# Patient Record
Sex: Male | Born: 1955
Health system: Southern US, Community
[De-identification: ages and names within clinical notes are randomized; demographics above are authoritative.]

## PROBLEM LIST (undated history)

## (undated) DIAGNOSIS — I7100 Dissection of unspecified site of aorta: Secondary | ICD-10-CM

## (undated) DIAGNOSIS — I10 Essential (primary) hypertension: Secondary | ICD-10-CM

## (undated) DIAGNOSIS — I2510 Atherosclerotic heart disease of native coronary artery without angina pectoris: Secondary | ICD-10-CM

## (undated) DIAGNOSIS — J3800 Paralysis of vocal cords and larynx, unspecified: Secondary | ICD-10-CM

## (undated) DIAGNOSIS — K219 Gastro-esophageal reflux disease without esophagitis: Secondary | ICD-10-CM

## (undated) HISTORY — DX: Essential (primary) hypertension: I10

## (undated) HISTORY — DX: Atherosclerotic heart disease of native coronary artery without angina pectoris: I25.10

## (undated) HISTORY — DX: Paralysis of vocal cords and larynx, unspecified: J38.00

## (undated) HISTORY — DX: Dissection of unspecified site of aorta: I71.00

## (undated) HISTORY — DX: Gastro-esophageal reflux disease without esophagitis: K21.9

---

## 1990-05-14 HISTORY — PX: LAPAROTOMY: SHX154

## 1994-05-14 HISTORY — PX: SEPTOPLASTY: SUR1290

## 2003-02-08 ENCOUNTER — Encounter: Admission: RE | Admit: 2003-02-08 | Discharge: 2003-05-09 | Payer: Self-pay | Admitting: Internal Medicine

## 2003-05-15 HISTORY — PX: LARYNGOPLASTY: SHX282

## 2003-10-28 ENCOUNTER — Encounter: Payer: Self-pay | Admitting: Internal Medicine

## 2003-12-17 ENCOUNTER — Ambulatory Visit (HOSPITAL_COMMUNITY): Admission: RE | Admit: 2003-12-17 | Discharge: 2003-12-17 | Payer: Self-pay | Admitting: Cardiology

## 2004-10-03 ENCOUNTER — Ambulatory Visit: Payer: Self-pay | Admitting: Internal Medicine

## 2004-10-10 ENCOUNTER — Ambulatory Visit: Payer: Self-pay | Admitting: Internal Medicine

## 2004-10-18 ENCOUNTER — Ambulatory Visit: Payer: Self-pay | Admitting: Internal Medicine

## 2005-01-26 ENCOUNTER — Ambulatory Visit: Payer: Self-pay | Admitting: Cardiovascular Disease

## 2005-02-05 ENCOUNTER — Ambulatory Visit: Payer: Self-pay

## 2005-05-14 HISTORY — PX: COLONOSCOPY: SHX174

## 2005-05-15 ENCOUNTER — Ambulatory Visit: Payer: Self-pay | Admitting: Internal Medicine

## 2005-09-17 ENCOUNTER — Ambulatory Visit: Payer: Self-pay | Admitting: Internal Medicine

## 2005-10-01 ENCOUNTER — Ambulatory Visit: Payer: Self-pay | Admitting: Internal Medicine

## 2005-10-29 ENCOUNTER — Ambulatory Visit: Payer: Self-pay | Admitting: Gastroenterology

## 2005-11-30 ENCOUNTER — Ambulatory Visit: Payer: Self-pay | Admitting: Gastroenterology

## 2006-08-15 ENCOUNTER — Ambulatory Visit: Payer: Self-pay | Admitting: Internal Medicine

## 2006-11-21 ENCOUNTER — Ambulatory Visit: Payer: Self-pay | Admitting: Internal Medicine

## 2006-11-26 ENCOUNTER — Encounter (INDEPENDENT_AMBULATORY_CARE_PROVIDER_SITE_OTHER): Payer: Self-pay | Admitting: *Deleted

## 2006-11-26 ENCOUNTER — Encounter: Payer: Self-pay | Admitting: Internal Medicine

## 2007-07-28 ENCOUNTER — Ambulatory Visit: Payer: Self-pay | Admitting: Internal Medicine

## 2008-02-11 ENCOUNTER — Telehealth (INDEPENDENT_AMBULATORY_CARE_PROVIDER_SITE_OTHER): Payer: Self-pay | Admitting: *Deleted

## 2008-04-29 ENCOUNTER — Ambulatory Visit: Payer: Self-pay | Admitting: Internal Medicine

## 2008-04-29 DIAGNOSIS — K219 Gastro-esophageal reflux disease without esophagitis: Secondary | ICD-10-CM | POA: Insufficient documentation

## 2008-04-29 DIAGNOSIS — I10 Essential (primary) hypertension: Secondary | ICD-10-CM | POA: Insufficient documentation

## 2008-05-31 ENCOUNTER — Telehealth (INDEPENDENT_AMBULATORY_CARE_PROVIDER_SITE_OTHER): Payer: Self-pay | Admitting: *Deleted

## 2009-01-13 ENCOUNTER — Encounter: Payer: Self-pay | Admitting: Internal Medicine

## 2009-07-08 ENCOUNTER — Telehealth (INDEPENDENT_AMBULATORY_CARE_PROVIDER_SITE_OTHER): Payer: Self-pay | Admitting: *Deleted

## 2009-08-05 ENCOUNTER — Ambulatory Visit: Payer: Self-pay | Admitting: Internal Medicine

## 2009-08-05 DIAGNOSIS — I2510 Atherosclerotic heart disease of native coronary artery without angina pectoris: Secondary | ICD-10-CM | POA: Insufficient documentation

## 2009-08-19 ENCOUNTER — Ambulatory Visit: Payer: Self-pay | Admitting: Family Medicine

## 2009-08-22 LAB — CONVERTED CEMR LAB
ALT: 19 units/L (ref 0–53)
AST: 19 U/L (ref 0–37)
Albumin: 4 g/dL (ref 3.5–5.2)
Alkaline Phosphatase: 39 units/L (ref 39–117)
BUN: 13 mg/dL (ref 6–23)
Basophils Absolute: 0 10*3/uL (ref 0.0–0.1)
Basophils Relative: 0.8 % (ref 0.0–3.0)
Bilirubin, Direct: 0.1 mg/dL (ref 0.0–0.3)
CO2: 30 meq/L (ref 19–32)
Calcium: 9.3 mg/dL (ref 8.4–10.5)
Chloride: 103 meq/L (ref 96–112)
Cholesterol: 206 mg/dL — ABNORMAL HIGH (ref 0–200)
Creatinine, Ser: 1.1 mg/dL (ref 0.4–1.5)
Direct LDL: 154.8 mg/dL
Eosinophils Absolute: 0.3 10*3/uL (ref 0.0–0.7)
Eosinophils Relative: 5.5 % — ABNORMAL HIGH (ref 0.0–5.0)
GFR calc non Af Amer: 73.65 mL/min (ref >60)
Glucose, Bld: 91 mg/dL (ref 70–99)
HCT: 44.1 % (ref 39.0–52.0)
HDL: 39.3 mg/dL (ref >39.00)
Hemoglobin: 15.2 g/dL (ref 13.0–17.0)
Lymphocytes Relative: 27.9 % (ref 12.0–46.0)
Lymphs Abs: 1.5 10*3/uL (ref 0.7–4.0)
MCHC: 34.4 g/dL (ref 30.0–36.0)
MCV: 90.9 fL (ref 78.0–100.0)
Monocytes Absolute: 0.4 10*3/uL (ref 0.1–1.0)
Monocytes Relative: 8.1 % (ref 3.0–12.0)
Neutro Abs: 3.2 10*3/uL (ref 1.4–7.7)
Neutrophils Relative %: 57.7 % (ref 43.0–77.0)
PSA: 0.77 ng/mL (ref 0.10–4.00)
Platelets: 165 10*3/uL (ref 150.0–400.0)
Potassium: 4.4 meq/L (ref 3.5–5.1)
RBC: 4.85 M/uL (ref 4.22–5.81)
RDW: 14 % (ref 11.5–14.6)
Sodium: 138 meq/L (ref 135–145)
TSH: 0.78 u[IU]/mL (ref 0.35–5.50)
Total Bilirubin: 0.9 mg/dL (ref 0.3–1.2)
Total CHOL/HDL Ratio: 5
Total Protein: 6.9 g/dL (ref 6.0–8.3)
Triglycerides: 125 mg/dL (ref 0.0–149.0)
VLDL: 25 mg/dL (ref 0.0–40.0)
WBC: 5.5 10*3/uL (ref 4.5–10.5)

## 2009-08-29 ENCOUNTER — Telehealth: Payer: Self-pay | Admitting: Internal Medicine

## 2009-09-19 ENCOUNTER — Telehealth: Payer: Self-pay | Admitting: Internal Medicine

## 2009-09-21 ENCOUNTER — Ambulatory Visit: Payer: Self-pay | Admitting: Internal Medicine

## 2009-09-21 DIAGNOSIS — M7660 Achilles tendinitis, unspecified leg: Secondary | ICD-10-CM | POA: Insufficient documentation

## 2009-10-13 ENCOUNTER — Ambulatory Visit: Payer: Self-pay | Admitting: Sports Medicine

## 2009-12-02 ENCOUNTER — Emergency Department (HOSPITAL_COMMUNITY): Admission: EM | Admit: 2009-12-02 | Discharge: 2009-12-02 | Payer: Self-pay | Admitting: Emergency Medicine

## 2009-12-06 ENCOUNTER — Telehealth (INDEPENDENT_AMBULATORY_CARE_PROVIDER_SITE_OTHER): Payer: Self-pay | Admitting: *Deleted

## 2009-12-07 ENCOUNTER — Encounter: Payer: Self-pay | Admitting: Internal Medicine

## 2009-12-07 ENCOUNTER — Emergency Department (HOSPITAL_COMMUNITY): Admission: EM | Admit: 2009-12-07 | Discharge: 2009-12-07 | Payer: Self-pay | Admitting: Emergency Medicine

## 2009-12-07 ENCOUNTER — Ambulatory Visit: Payer: Self-pay | Admitting: Vascular Surgery

## 2009-12-07 ENCOUNTER — Ambulatory Visit (HOSPITAL_COMMUNITY): Admission: RE | Admit: 2009-12-07 | Discharge: 2009-12-07 | Payer: Self-pay | Admitting: Internal Medicine

## 2009-12-07 ENCOUNTER — Ambulatory Visit: Payer: Self-pay | Admitting: Internal Medicine

## 2009-12-07 DIAGNOSIS — R609 Edema, unspecified: Secondary | ICD-10-CM | POA: Insufficient documentation

## 2009-12-08 ENCOUNTER — Ambulatory Visit: Payer: Self-pay | Admitting: Internal Medicine

## 2009-12-08 DIAGNOSIS — I82409 Acute embolism and thrombosis of unspecified deep veins of unspecified lower extremity: Secondary | ICD-10-CM | POA: Insufficient documentation

## 2009-12-09 ENCOUNTER — Ambulatory Visit: Payer: Self-pay | Admitting: Internal Medicine

## 2009-12-19 ENCOUNTER — Ambulatory Visit: Payer: Self-pay | Admitting: Internal Medicine

## 2009-12-19 LAB — CONVERTED CEMR LAB: POC INR: 1.3

## 2009-12-21 ENCOUNTER — Ambulatory Visit: Payer: Self-pay | Admitting: Internal Medicine

## 2009-12-21 LAB — CONVERTED CEMR LAB: INR: 1.5

## 2009-12-26 ENCOUNTER — Ambulatory Visit: Payer: Self-pay | Admitting: Internal Medicine

## 2009-12-26 LAB — CONVERTED CEMR LAB: POC INR: 1.9

## 2009-12-27 ENCOUNTER — Telehealth (INDEPENDENT_AMBULATORY_CARE_PROVIDER_SITE_OTHER): Payer: Self-pay | Admitting: *Deleted

## 2010-01-20 ENCOUNTER — Ambulatory Visit: Payer: Self-pay | Admitting: Internal Medicine

## 2010-01-20 LAB — CONVERTED CEMR LAB: POC INR: 1.8

## 2010-02-10 ENCOUNTER — Ambulatory Visit: Payer: Self-pay | Admitting: Internal Medicine

## 2010-02-10 LAB — CONVERTED CEMR LAB: INR: 1.7

## 2010-02-20 ENCOUNTER — Ambulatory Visit: Payer: Self-pay | Admitting: Internal Medicine

## 2010-02-20 LAB — CONVERTED CEMR LAB: INR: 2.4

## 2010-03-24 ENCOUNTER — Ambulatory Visit: Payer: Self-pay | Admitting: Internal Medicine

## 2010-03-24 LAB — CONVERTED CEMR LAB: INR: 2.1

## 2010-03-27 ENCOUNTER — Telehealth: Payer: Self-pay | Admitting: Internal Medicine

## 2010-03-30 ENCOUNTER — Encounter: Payer: Self-pay | Admitting: Internal Medicine

## 2010-05-29 ENCOUNTER — Telehealth (INDEPENDENT_AMBULATORY_CARE_PROVIDER_SITE_OTHER): Payer: Self-pay | Admitting: *Deleted

## 2010-06-02 ENCOUNTER — Ambulatory Visit
Admission: RE | Admit: 2010-06-02 | Discharge: 2010-06-02 | Payer: Self-pay | Source: Home / Self Care | Attending: Internal Medicine | Admitting: Internal Medicine

## 2010-06-02 LAB — CONVERTED CEMR LAB: INR: 2.5

## 2010-06-11 LAB — CONVERTED CEMR LAB
ALT: 33 U/L (ref 0–53)
AST: 30 units/L (ref 0–37)
Albumin: 3.9 g/dL (ref 3.5–5.2)
Alkaline Phosphatase: 44 U/L (ref 39–117)
BUN: 14 mg/dL (ref 6–23)
Basophils Absolute: 0 10*3/uL (ref 0.0–0.1)
Basophils Relative: 0.2 % (ref 0.0–1.0)
Bilirubin, Direct: 0.1 mg/dL (ref 0.0–0.3)
CO2: 30 meq/L (ref 19–32)
Calcium: 9.3 mg/dL (ref 8.4–10.5)
Chloride: 99 meq/L (ref 96–112)
Cholesterol: 238 mg/dL (ref 0–200)
Creatinine, Ser: 1.2 mg/dL (ref 0.4–1.5)
Direct LDL: 171.9 mg/dL
Eosinophils Absolute: 0.2 10*3/uL (ref 0.0–0.6)
Eosinophils Relative: 5.4 % — ABNORMAL HIGH (ref 0.0–5.0)
GFR calc Af Amer: 81 mL/min
GFR calc non Af Amer: 67 mL/min
Glucose, Bld: 95 mg/dL (ref 70–99)
HCT: 44.6 % (ref 39.0–52.0)
HDL: 47.3 mg/dL (ref >39.0)
Hemoglobin: 15.3 g/dL (ref 13.0–17.0)
Hgb A1c MFr Bld: 5.7 % (ref 4.6–6.0)
Lymphocytes Relative: 31.9 % (ref 12.0–46.0)
MCHC: 34.2 g/dL (ref 30.0–36.0)
MCV: 91.4 fL (ref 78.0–100.0)
Monocytes Absolute: 0.3 10*3/uL (ref 0.2–0.7)
Monocytes Relative: 7.6 % (ref 3.0–11.0)
Neutro Abs: 2.2 10*3/uL (ref 1.4–7.7)
Neutrophils Relative %: 54.9 % (ref 43.0–77.0)
PSA: 0.53 ng/mL (ref 0.10–4.00)
Platelets: 177 10*3/uL (ref 150–400)
Potassium: 4.6 meq/L (ref 3.5–5.1)
RBC: 4.88 M/uL (ref 4.22–5.81)
RDW: 12.8 % (ref 11.5–14.6)
Sodium: 136 meq/L (ref 135–145)
TSH: 0.58 u[IU]/mL (ref 0.35–5.50)
Testosterone: 543.03 ng/dL (ref 350.00–890)
Total Bilirubin: 1.2 mg/dL (ref 0.3–1.2)
Total CHOL/HDL Ratio: 5
Total Protein: 6.6 g/dL (ref 6.0–8.3)
Triglycerides: 64 mg/dL (ref 0–149)
VLDL: 13 mg/dL (ref 0–40)
WBC: 3.9 10*3/uL — ABNORMAL LOW (ref 4.5–10.5)

## 2010-06-13 NOTE — Assessment & Plan Note (Signed)
Summary: RETURN VISIT FROM YESTERDAY///SPH   Vital Signs:  Patient profile:   55 year old male Weight:      198 pounds Pulse rate:   72 / minute Resp:     14 per minute BP standing:   110 / 70  (left arm)  Vitals Entered By: Shonna Chock CMA (December 08, 2009 12:08 PM) CC: Follow-up visit: copy of report given   CC:  Follow-up visit: copy of report given.  History of Present Illness: Mr. Holdren was started on Lovenox & Coumadin in ER 12/07/2009 for L popliteal DVT. It is still unclear why this occurred. He had been using a desk chair which appeared to compress that area. The LLE edema  & pain have  decreased since 07/27. No cardiopulmonary symptoms.  Current Medications (verified): 1)  Adult Aspirin Ec Low Strength 81 Mg  Tbec (Aspirin) .Marland Kitchen.. 1 By Mouth Qd 2)  Multivitamin 3)  Fish Oil 4)  Dhea 5)  Tribilus 6)  Ramipril 2.5 Mg  Caps (Ramipril) .... Take One Capsule Daily 7)  Zantac 150 Maximum Strength 150 Mg  Tabs (Ranitidine Hcl) .Marland Kitchen.. 1 Bid 8)  Amlodipine Besylate 2.5  Mg Tabs (Amlodipine Besylate) .Marland Kitchen.. 1 Once Daily 9)  Nitroglycerin 0.2 Mg/hr Pt24 (Nitroglycerin) .... 1/4 Patch Applied To Right Heel Every 24 Hours 10)  Arthrotec 50-200 Mg-Mcg Tabs (Diclofenac-Misoprostol) .Marland Kitchen.. 1 Qid Until Knee Pain & Swelling Gone  Allergies: 1)  ! * Detrol La  Review of Systems CV:  Denies chest pain or discomfort. Resp:  Denies chest pain with inspiration, coughing up blood, pleuritic, and shortness of breath.  Physical Exam  General:  well-nourished,in no acute distress; alert,appropriate and cooperative throughout examination Lungs:  Normal respiratory effort, chest expands symmetrically. Lungs are clear to auscultation, no crackles or wheezes. Heart:  Normal rate and regular rhythm. S1 and S2 normal without gallop, murmur, click, rub or other extra sounds. Pulses:  R and L carotid,radial,dorsalis pedis and posterior tibial pulses are full and equal bilaterally Extremities:  1/2+ left  pedal edema.     Impression & Recommendations:  Problem # 1:  DEEP VENOUS THROMBOPHLEBITIS, LEG, LEFT (ICD-453.40)  Acute L Popliteal Vein  Orders: Venipuncture (19147) TLB-PT (Protime) (85610-PTP)  Problem # 2:  EDEMA- LOCALIZED (ICD-782.3) Improved  Complete Medication List: 1)  Adult Aspirin Ec Low Strength 81 Mg Tbec (Aspirin) .Marland Kitchen.. 1 by mouth qd 2)  Multivitamin  3)  Fish Oil  4)  Dhea  5)  Tribilus  6)  Ramipril 2.5 Mg Caps (Ramipril) .... Take one capsule daily 7)  Zantac 150 Maximum Strength 150 Mg Tabs (Ranitidine hcl) .Marland Kitchen.. 1 bid 8)  Amlodipine Besylate 2.5 Mg Tabs (amlodipine Besylate)  .Marland Kitchen.. 1 once daily 9)  Nitroglycerin 0.2 Mg/hr Pt24 (Nitroglycerin) .... 1/4 patch applied to right heel every 24 hours 10)  Arthrotec 50-200 Mg-mcg Tabs (Diclofenac-misoprostol) .Marland Kitchen.. 1 qid until knee pain & swelling gone 11)  Warfarin Sodium 5 Mg Tabs (Warfarin sodium) .... Once daily after 5 pm as per instructions  Patient Instructions: 1)  Stop aspirin while on warfarin. Have a PT/INR in 5-7 days. PT/INR goal = 2.0-3.5. Elevate leg as much as possible. Support hose when up on L leg. Prescriptions: WARFARIN SODIUM 5 MG TABS (WARFARIN SODIUM) once daily after 5 pm as per instructions  #30 x 0   Entered and Authorized by:   Marga Melnick MD   Signed by:   Marga Melnick MD on 12/08/2009   Method used:  Faxed to ...       [facility]  [phone]* (retail)       [address] [place], [place]  [address]       Ph: [phone] or [phone]       Fax: [phone]   RxID:   [id number]   Appended Document: RETURN VISIT FROM YESTERDAY///SPH   INR POC 1.1  Vital Signs: Weight: 198 lbs.  Pulse Rate: 68  Blood Pressure:  110 / 70   Dietary changes: no    Health status changes: no    Bleeding/hemorrhagic complications: no    Recent/future hospitalizations: no    Any changes in medication regimen? no    Recent/future dental: no  Any missed doses?: no         Is patient compliant with meds? yes      Comments: INR   1.1 given to Dr. Alwyn Ren waiting further instructions.: warfarin 5 mg 1& 1/2 today ,then 1 once daily . Check PT/INR in 5-7 days in [place] while on vacation [place]  December 08, 2009 12:48 PM

## 2010-06-13 NOTE — Assessment & Plan Note (Signed)
 Summary: skin rash--acute only--tl   Vital Signs:  Patient Profile:   55 Years Old Male Weight:      185.38 pounds Pulse rate:   60 / minute Pulse rhythm:   regular Resp:     15 per minute BP sitting:   110 / 80  (left arm) Cuff size:   large  Pt. in pain?   no  Vitals Entered By: Scarlette Currier (July 28, 2007 5:00 PM)                  Chief Complaint:  rash all over body.  History of Present Illness:  Rash started on neck 1 week ago  but spread to involve  hands, face, legs and feet,worse last night and this am with itching. Rx: Benadryl, topical cortsone. No significant travel exposures, new meds, new clothing.    Current Allergies (reviewed today): ! * DETROL LA  Past Medical History:    Reviewed history and no changes required:       Current Problems:        DECREASED LIBIDO (ICD-799.81)       WEAKNESS, MUSCLE (ICD-728.87)       SYMPTOM, FREQUENCY, URINARY (ICD-788.41)       EXAMINATION, ROUTINE MEDICAL (ICD-V70.0)  Past Surgical History:    Reviewed history from 11/21/2006 and no changes required:       extensive cardiac hx ; MI/ stent 2005;1999 SBO S/P resection scar tissue; 1992 exploratory lap;laryngoplasty 2005; colonoscopy neg 2007; septoplasty   Family History:    Reviewed history from 11/21/2006 and no changes required:       Father: Parkinson's       Mother: breast CA       Siblings: neg  Social History:    Reviewed history and no changes required:       Never Smoked       Alcohol use-no   Risk Factors:  Tobacco use:  never Alcohol use:  no   Review of Systems  General      Denies chills, fever, sweats, and weight loss.  Eyes      Denies discharge, eye pain, and red eye.  ENT      Denies nasal congestion and sinus pressure.      No facial pain, frontal ha, purulence. No angioedema.  Resp      Denies cough, shortness of breath, sputum productive, and wheezing.   Physical Exam  General:     Well-developed,well-nourished,in  no acute distress; alert,appropriate and cooperative throughout examination Eyes:     No corneal or conjunctival inflammation noted. EOMI. Perrla.Vision grossly normal. Mouth:     Oral mucosa and oropharynx without lesions or exudates.  Teeth in good repair. Lungs:     Normal respiratory effort, chest expands symmetrically. Lungs are clear to auscultation, no crackles or wheezes. Abdomen:     Bowel sounds positive,abdomen soft and non-tender without masses, organomegaly or hernias noted. Midline op scar Skin:     Diffuse  urticaria. dermatographia. Cervical Nodes:     No lymphadenopathy noted Axillary Nodes:     No palpable lymphadenopathy    Impression & Recommendations:  Problem # 1:  URTICARIA (ICD-708.9)  Problem # 2:  HYPERTENSION, ESSENTIAL NOS (ICD-401.9)  The following medications were removed from the medication list:    Altace  2.5 Mg Caps (Ramipril ) .Aaron Aas... 1 by mouth qd  His updated medication list for this problem includes:    Ramipril  2.5 Mg Caps (Ramipril ) .Aaron Aas... Take one capsule daily  Complete Medication List: 1)  Adult Aspirin Ec Low Strength 81 Mg Tbec (Aspirin) .Aaron Aas.. 1 by mouth qd 2)  Multivitamin  3)  Fish Oil  4)  Dhea  5)  Tribilus  6)  Ramipril  2.5 Mg Caps (Ramipril ) .... Take one capsule daily 7)  Hydroxyzine Pamoate 25 Mg Caps (Hydroxyzine pamoate) .Aaron Aas.. 1-2 q 6 hrs as needed for hives 8)  Zantac  150 Maximum Strength 150 Mg Tabs (Ranitidine  hcl) .Aaron Aas.. 1 bid 9)  Prednisone 20 Mg Tabs (Prednisone) .Aaron Aas.. 1 two times a day with foodx 7 days then 1 qd   Patient Instructions: 1)  Bland diet; hypoallergic soap. Avoid specific foods as discussed. Hold ALL supplements  & meds.  2)  Check your Blood Pressure regularly. If it is above:130/85 on average you should make an appointment.    Prescriptions: PREDNISONE 20 MG  TABS (PREDNISONE) 1 two times a day with foodX 7 days then 1 qd  #21 x 0   Entered and Authorized by:   Loetta Ringer MD   Signed by:    Loetta Ringer MD on 07/28/2007   Method used:   Print then Give to Patient   RxID:   [id number] ZANTAC  150 MAXIMUM STRENGTH 150 MG  TABS (RANITIDINE  HCL) 1 bid  #60 x 0   Entered and Authorized by:   Loetta Ringer MD   Signed by:   Loetta Ringer MD on 07/28/2007   Method used:   Print then Give to Patient   RxID:   [id number] HYDROXYZINE PAMOATE 25 MG  CAPS (HYDROXYZINE PAMOATE) 1-2 q 6 hrs as needed for hives  #30 x 1   Entered and Authorized by:   Loetta Ringer MD   Signed by:   Loetta Ringer MD on 07/28/2007   Method used:   Print then Give to Patient   RxID:   [id number]  ]

## 2010-06-13 NOTE — Assessment & Plan Note (Signed)
Summary: remove stiches/PT/INR/cbs   Vital Signs:  Patient profile:   55 year old male Weight:      198.6 pounds Pulse rate:   72 / minute Resp:     15 per minute BP sitting:   104 / 70  (left arm) Cuff size:   large  Vitals Entered By: Shonna Chock CMA (December 21, 2009 3:03 PM) CC: 1.) Remove stitches- patient   2.) PT/INR Check   CC:  1.) Remove stitches- patient   2.) PT/INR Check.  History of Present Illness: These sutures were placed 12/11/2009 in ER in [place]. The wound had dehisced after he had been applying pressure in attempt to enhance healing.  A  PT/INR  was done 07/31 @ ER when laceration was restitched; he'll get the results. It was 1.3 on 08/08  here & 1.5 after 10 mg once daily X 2 days. Despite subtherapeutic reading he denies any C-P symptoms.He has been applying Neosporin to wound.  Current Medications (verified): 1)  Multivitamin 2)  Fish Oil 3)  Dhea 4)  Tribilus 5)  Ramipril 2.5 Mg  Caps (Ramipril) .... Take One Capsule Daily 6)  Zantac 150 Maximum Strength 150 Mg  Tabs (Ranitidine Hcl) .Marland Kitchen.. 1 Bid 7)  Amlodipine Besylate 2.5  Mg Tabs (Amlodipine Besylate) .Marland Kitchen.. 1 Once Daily 8)  Warfarin Sodium 5 Mg Tabs (Warfarin Sodium) .... Once Daily After 5 Pm As Per Instructions  Allergies: 1)  ! * Detrol La  Review of Systems General:  Denies chills, fever, and sweats. CV:  Denies chest pain or discomfort. Resp:  Denies chest pain with inspiration, coughing up blood, and pleuritic. Heme:  Denies abnormal bruising and bleeding.  Physical Exam  General:  well-nourished,in no acute distress; alert,appropriate and cooperative throughout examination Lungs:  Normal respiratory effort, chest expands symmetrically. Lungs are clear to auscultation, no crackles or wheezes. Heart:  regular rhythm, no murmur, no gallop, no rub, no JVD, and bradycardia.   Extremities:  No edema of LLE; neg Homan's Skin:  wound healing well w/o purulence or cellulitis. 3 sutures removed  w/o complication Cervical Nodes:  No lymphadenopathy noted Axillary Nodes:  No palpable lymphadenopathy   Impression & Recommendations:  Problem # 1:  ENCOUNTER FOR REMOVAL OF SUTURES (ICD-V58.32)  Problem # 2:  ENCOUNTER FOR LONG-TERM USE OF ANTICOAGULANTS (ICD-V58.61)  Problem # 3:  DEEP VENOUS THROMBOPHLEBITIS, LEG, LEFT (ICD-453.40)  Orders: Protime (53664QI)  Complete Medication List: 1)  Multivitamin  2)  Fish Oil  3)  Dhea  4)  Tribilus  5)  Ramipril 2.5 Mg Caps (Ramipril) .... Take one capsule daily 6)  Zantac 150 Maximum Strength 150 Mg Tabs (Ranitidine hcl) .Marland Kitchen.. 1 bid 7)  Amlodipine Besylate 2.5 Mg Tabs (amlodipine Besylate)  .Marland Kitchen.. 1 once daily 8)  Warfarin Sodium 5 Mg Tabs (Warfarin sodium) .... Once daily after 5 pm as per instructions  Patient Instructions: 1)  Coumadin 10 mg 08/10 & 08/11 then 5 mg 08/12, 13 & 14. PT/INR on Mon 08/15. Stop Neosporin ointment.   ANTICOAGULATION RECORD       NEW REGIMEN & LAB RESULTS Current INR: 1.5 Current Coumadin Dose(mg): Patient took 10mg  x 2  Regimen:   (no change)  Provider: Marga Melnick Other Comments: Last PT/INR check was Monday  MEDICATIONS * MULTIVITAMIN  * FISH OIL  * DHEA  * TRIBILUS  RAMIPRIL 2.5 MG  CAPS (RAMIPRIL) Take one capsule daily ZANTAC 150 MAXIMUM STRENGTH 150 MG  TABS (RANITIDINE HCL) 1 bid *  AMLODIPINE BESYLATE 2.5  MG TABS (AMLODIPINE BESYLATE) 1 once daily WARFARIN SODIUM 5 MG TABS (WARFARIN SODIUM) once daily after 5 pm as per instructions   Anticoagulation Visit Questionnaire      Coumadin dose missed/changed:  No      Abnormal Bleeding Symptoms:  No   Any diet changes including alcohol intake, vegetables or greens since the last visit:  No Any illnesses or hospitalizations since the last visit:  No Any signs of clotting since the last visit (including chest discomfort, dizziness, shortness of breath, arm tingling, slurred speech, swelling or redness in leg):  No

## 2010-06-13 NOTE — Assessment & Plan Note (Signed)
Summary: PT/CBS/Kafi Dotter  Nurse Visit   Vital Signs:  Patient profile:   55 year old male Height:      75.5 inches (191.77 cm) Weight:      206.50 pounds (93.86 kg) BMI:     25.56 Temp:     97.9 degrees F (36.61 degrees C) oral BP sitting:   116 / 70  (left arm) Cuff size:   regular  Vitals Entered By: Lucious Groves CMA (March 24, 2010 11:21 AM) CC: PT check/kb   Allergies: 1)  ! * Detrol La Laboratory Results   Blood Tests      INR: 2.1   (Normal Range: 0.88-1.12   Therap INR: 2.0-3.5)    Orders Added: 1)  Est. Patient Level I [16109] 2)  Protime [60454UJ]   ANTICOAGULATION RECORD PREVIOUS REGIMEN & LAB RESULTS   Previous INR:  2.4 on  02/20/2010 Previous Coumadin Dose(mg):  10mg  daily on  02/20/2010 Previous Regimen:  10mg  daily on  02/10/2010  NEW REGIMEN & LAB RESULTS Current INR: 2.1 Current Coumadin Dose(mg): 10 mg qd Regimen: 10mg  daily  (no change)  Provider: Alwyn Ren Repeat testing in: 3 weeks Other Comments: Patient notes that he has 10mg  tabs and takes one daily (no alternating). Per MD the patient will change to 10mg  once daily except for 15mg  on Wed. and re-check in 3 weeks. Lucious Groves CMA  March 24, 2010 11:18 AM   Dose has been reviewed with patient or caretaker during this visit. Reviewed by: Lucious Groves  Anticoagulation Visit Questionnaire Coumadin dose missed/changed:  No Abnormal Bleeding Symptoms:  No  Any diet changes including alcohol intake, vegetables or greens since the last visit:  No Any illnesses or hospitalizations since the last visit:  No Any signs of clotting since the last visit (including chest discomfort, dizziness, shortness of breath, arm tingling, slurred speech, swelling or redness in leg):  No  MEDICATIONS * MULTIVITAMIN  * FISH OIL  * DHEA  * TRIBILUS  RAMIPRIL 2.5 MG  CAPS (RAMIPRIL) Take one capsule daily * AMLODIPINE BESYLATE 2.5  MG TABS (AMLODIPINE BESYLATE) 1 once daily WARFARIN SODIUM 5 MG TABS  (WARFARIN SODIUM) as directed based on PT Check WARFARIN SODIUM 10 MG TABS (WARFARIN SODIUM) as directed based on PT check OMEPRAZOLE 20 MG CPDR (OMEPRAZOLE) 1 pill 30 min pre b'fast

## 2010-06-13 NOTE — Assessment & Plan Note (Signed)
Summary: pain in left leg w/ ankle swelling//kn   Vital Signs:  Patient profile:   55 year old male Weight:      198 pounds Temp:     98.3 degrees F oral Pulse rate:   72 / minute Resp:     15 per minute BP sitting:   118 / 70  (left arm) Cuff size:   large  Vitals Entered By: Shonna Chock CMA (December 07, 2009 12:06 PM) CC: Left leg pain and swelling since Friday, Lower Extremity Joint pain   CC:  Left leg pain and swelling since Friday and Lower Extremity Joint pain.  History of Present Illness:   Knee Pain      This is a 55 year old man who presents with L knee &  Lower Extremity Pain over past 4-5 days, ? since 07/23.  The patient reports swelling as of 07/26  and decreased ROM with knee flexion. He was doing leg presses 07/25. He  denies redness, giving away, locking, popping, stiffness for >1 hr, and weakness.  The pain is located in the L posterior  knee & calf.  The pain began with no injury.  The pain is described as aching, intermittent, and activity related .Walking "stiffens " it.. No evaluation to date.  The patient denies the following symptoms: fever, rash, photosensitivity, eye symptoms, diarrhea, and dysuria.   Rx: none ; elevation helps it. No  associated cardiopulmonary symptoms. No PMH  or FH of DVT.  Current Medications (verified): 1)  Adult Aspirin Ec Low Strength 81 Mg  Tbec (Aspirin) .Marland Kitchen.. 1 By Mouth Qd 2)  Multivitamin 3)  Fish Oil 4)  Dhea 5)  Tribilus 6)  Ramipril 2.5 Mg  Caps (Ramipril) .... Take One Capsule Daily 7)  Zantac 150 Maximum Strength 150 Mg  Tabs (Ranitidine Hcl) .Marland Kitchen.. 1 Bid 8)  Amlodipine Besylate 2.5  Mg Tabs (Amlodipine Besylate) .Marland Kitchen.. 1 Once Daily 9)  Nitroglycerin 0.2 Mg/hr Pt24 (Nitroglycerin) .... 1/4 Patch Applied To Right Heel Every 24 Hours  Allergies: 1)  ! * Detrol La  Review of Systems General:  Denies chills and sweats. Eyes:  Denies discharge, eye pain, and red eye. CV:  Complains of swelling of feet; denies chest pain or  discomfort, difficulty breathing at night, and difficulty breathing while lying down; Edema L calf & ankle. Resp:  Denies chest pain with inspiration, coughing up blood, shortness of breath, and wheezing. GU:  Denies discharge and hematuria.  Physical Exam  General:  well-nourished,in no acute distress; alert,appropriate and cooperative throughout examination Lungs:  Normal respiratory effort, chest expands symmetrically. Lungs are clear to auscultation, no crackles or wheezes. Heart:  Normal rate and regular rhythm. S1 and S2 normal without gallop, murmur, click, rub or other extra sounds. Abdomen:  Bowel sounds positive,abdomen soft and non-tender without masses, organomegaly or hernias noted.Op scar well healed Pulses:  R and L dorsalis pedis and posterior tibial pulses are full and equal bilaterally Extremities:  No clubbing, cyanosis. Trace - 1/2 + edema LLE to mid shin.Decreased ROM with flexion  L knee with slight crepitus .  tender L popliteal space . Neg Homan's  Neurologic:  alert & oriented X3.   Skin:  Intact without suspicious lesions or rashes Psych:  memory intact for recent and remote, normally interactive, and good eye contact.     Impression & Recommendations:  Problem # 1:  POPLITEAL CYST, LEFT (ICD-727.51)  acute  Orders: Radiology Referral (Radiology)  Problem # 2:  EDEMA- LOCALIZED (ICD-782.3)  due to #1  Orders: Radiology Referral (Radiology)  Problem # 3:  GERD (ICD-530.81) No PMH of ulcer His updated medication list for this problem includes:    Zantac 150 Maximum Strength 150 Mg Tabs (Ranitidine hcl) .Marland Kitchen... 1 bid  Complete Medication List: 1)  Adult Aspirin Ec Low Strength 81 Mg Tbec (Aspirin) .Marland Kitchen.. 1 by mouth qd 2)  Multivitamin  3)  Fish Oil  4)  Dhea  5)  Tribilus  6)  Ramipril 2.5 Mg Caps (Ramipril) .... Take one capsule daily 7)  Zantac 150 Maximum Strength 150 Mg Tabs (Ranitidine hcl) .Marland Kitchen.. 1 bid 8)  Amlodipine Besylate 2.5 Mg Tabs  (amlodipine Besylate)  .Marland Kitchen.. 1 once daily 9)  Nitroglycerin 0.2 Mg/hr Pt24 (Nitroglycerin) .... 1/4 patch applied to right heel every 24 hours 10)  Arthrotec 50-200 Mg-mcg Tabs (Diclofenac-misoprostol) .Marland Kitchen.. 1 qid until knee pain & swelling gone  Patient Instructions: 1)  Rest & , moist compresses & elevation as discussed. Go to Web MD for "POPLITEAL CYST" Prescriptions: ARTHROTEC 50-200 MG-MCG TABS (DICLOFENAC-MISOPROSTOL) 1 qid until knee pain & swelling gone  #30 x 0   Entered and Authorized by:   Marga Melnick MD   Signed by:   Marga Melnick MD on 12/07/2009   Method used:   Print then Give to Patient   RxID:   [id number]
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
## 2010-06-13 NOTE — Progress Notes (Signed)
 Summary: hop--refill  Phone Note Refill Request   Refills Requested: Medication #1:  RAMIPRIL  2.5 MG  CAPS Take one capsule daily [facility]--[phone]  Initial call taken by: Thompson Flight,  February 11, 2008 9:05 AM      Prescriptions: RAMIPRIL  2.5 MG  CAPS (RAMIPRIL ) Take one capsule daily  #90 x 0   Entered by:   Olla Bevels   Authorized by:   Loetta Ringer MD   Signed by:   Olla Bevels on 02/11/2008   Method used:   Printed then faxed to ...       [facility]* (mail-order)             ,          Ph: [phone]       Fax: [phone]   RxID:   [id number]

## 2010-06-13 NOTE — Progress Notes (Signed)
Summary: BP elevated  Phone Note Call from Patient Call back at [phone]   Caller: Patient Summary of Call: pt states that BP remains high at 130/90 on sunday and was taking by nurse.  Pt states that previous BP 140/90, and lower number never goes under 90. pt currently taking RAMIPRIL 2.5 MG one capsule daily.[facility]. pls advise  Follow-up for Phone Call        per dr Samreen Seltzer increase med to ramipril 5 mg once daily. pt aware,pt advise to call if BP still elevated...........Marland KitchenFelecia Deloach CMA  August 29, 2009 4:33 PM

## 2010-06-13 NOTE — Assessment & Plan Note (Signed)
Summary: talk to dr Kalyna Paolella & pt/cbs   Vital Signs:  Patient profile:   55 year old male Weight:      203 pounds BMI:     25.13 Temp:     97.7 degrees F oral Pulse rate:   76 / minute Resp:     15 per minute BP sitting:   114 / 72  (left arm) Cuff size:   large  Vitals Entered By: Shonna Chock CMA (February 20, 2010 1:23 PM) CC: 1.) Ask Dr.Gayle Martinez some questions  2.) PT Check   CC:  1.) Ask Dr.Kenedie Dirocco some questions  2.) PT Check.  History of Present Illness: He is still having edema intermittently ; compression garments help. He is concerned about clot "breaking off". PT/INR was 2.4 today, therapeutic.  Allergies: 1)  ! * Detrol La  Review of Systems CV:  Denies chest pain or discomfort, difficulty breathing at night, difficulty breathing while lying down, leg cramps with exertion, and shortness of breath with exertion. Resp:  Denies chest pain with inspiration, coughing up blood, shortness of breath, and wheezing. GI:  Complains of indigestion; denies abdominal pain, bloody stools, and dark tarry stools; Dyspepsia despite Ranitidine two times a day .  Physical Exam  General:  well-nourished,in no acute distress; alert,appropriate and cooperative throughout examination Lungs:  Normal respiratory effort, chest expands symmetrically. Lungs are clear to auscultation, no crackles or wheezes. Heart:  regular rhythm, no murmur, no gallop, no rub, and bradycardia.  S4 Pulses:  R and L  dorsalis pedis and posterior tibial pulses are full and equal bilaterally Extremities:  No clubbing, cyanosis, edema. Negative Homan's   Impression & Recommendations:  Problem # 1:  DEEP VENOUS THROMBOPHLEBITIS, LEG, LEFT (ICD-453.40)  Problem # 2:  ENCOUNTER FOR LONG-TERM USE OF ANTICOAGULANTS (ICD-V58.61)  Orders: Protime (16109UE)  Problem # 3:  GERD (ICD-530.81)  The following medications were removed from the medication list:    Zantac 150 Maximum Strength 150 Mg Tabs (Ranitidine hcl)  .Marland Kitchen... 1 bid His updated medication list for this problem includes:    Omeprazole 20 Mg Cpdr (Omeprazole) .Marland Kitchen... 1 pill 30 min pre b'fast  Complete Medication List: 1)  Multivitamin  2)  Fish Oil  3)  Dhea  4)  Tribilus  5)  Ramipril 2.5 Mg Caps (Ramipril) .... Take one capsule daily 6)  Amlodipine Besylate 2.5 Mg Tabs (amlodipine Besylate)  .Marland Kitchen.. 1 once daily 7)  Warfarin Sodium 5 Mg Tabs (Warfarin sodium) .... As directed based on pt check 8)  Warfarin Sodium 10 Mg Tabs (Warfarin sodium) .... As directed based on pt check 9)  Omeprazole 20 Mg Cpdr (Omeprazole) .Marland Kitchen.. 1 pill 30 min pre b'fast  Patient Instructions: 1)  No change in dose  ; recheck PT/INR in 4 weeks. Warfarin  X 6 months then repeat Venous Doppler. If negative ; then repeat Coagulation Panel off warfarin. 2)  Avoid foods high in acid (tomatoes, citrus juices, spicy foods). Avoid eating within two hours of lying down or before exercising. Do not over eat; try smaller more frequent meals. Elevate head of bed twelve inches when sleeping. Prescriptions: OMEPRAZOLE 20 MG CPDR (OMEPRAZOLE) 1 pill 30 min pre b'fast  #30 x 2   Entered and Authorized by:   Marga Melnick MD   Signed by:   Marga Melnick MD on 02/20/2010   Method used:   Print then Give to Patient   RxID:   [id number]    ANTICOAGULATION RECORD PREVIOUS REGIMEN & LAB  RESULTS   Previous INR:  1.7 on  02/10/2010 Previous Coumadin Dose(mg):  10mg  M/W/F/Sun, 7.5mg  all other days  on  02/10/2010 Previous Regimen:  10mg  daily on  02/10/2010  NEW REGIMEN & LAB RESULTS Current INR: 2.4 Current Coumadin Dose(mg): 10mg  daily Regimen: 10mg  daily  (no change)  Provider: Raye Slyter MEDICATIONS * MULTIVITAMIN  * FISH OIL  * DHEA  * TRIBILUS  RAMIPRIL 2.5 MG  CAPS (RAMIPRIL) Take one capsule daily * AMLODIPINE BESYLATE 2.5  MG TABS (AMLODIPINE BESYLATE) 1 once daily WARFARIN SODIUM 5 MG TABS (WARFARIN SODIUM) as directed based on PT Check WARFARIN SODIUM  10 MG TABS (WARFARIN SODIUM) as directed based on PT check OMEPRAZOLE 20 MG CPDR (OMEPRAZOLE) 1 pill 30 min pre b'fast   Anticoagulation Visit Questionnaire      Coumadin dose missed/changed:  No      Abnormal Bleeding Symptoms:  No   Any diet changes including alcohol intake, vegetables or greens since the last visit:  No Any illnesses or hospitalizations since the last visit:  No Any signs of clotting since the last visit (including chest discomfort, dizziness, shortness of breath, arm tingling, slurred speech, swelling or redness in leg):  No

## 2010-06-13 NOTE — Assessment & Plan Note (Signed)
Summary: PT CHECK//PH//ARRIVE AT 11AM  Nurse Visit   Vital Signs:  Patient profile:   55 year old male Weight:      200.4 pounds Pulse rate:   72 / minute BP sitting:   112 / 68  (left arm) Cuff size:   large  Vitals Entered By: Shonna Chock CMA (February 10, 2010 11:07 AM) CC: PT Check   Allergies: 1)  ! * Detrol La Laboratory Results   Blood Tests      INR: 1.7   (Normal Range: 0.88-1.12   Therap INR: 2.0-3.5)    Orders Added: 1)  Protime [85610QW] 2)  Est. Patient Level I [16109] Prescriptions: WARFARIN SODIUM 10 MG TABS (WARFARIN SODIUM) once daily on T. Th. Sat  #30 x 2   Entered by:   Shonna Chock CMA   Authorized by:   Marga Melnick MD   Signed by:   Shonna Chock CMA on 02/10/2010   Method used:   [facility] to        [phone][facility] (retail)       [address] [place], [place]  [address]       Ph: [phone] or [phone]       Fax: [phone]   RxID:   [id number]    ANTICOAGULATION RECORD PREVIOUS REGIMEN & LAB RESULTS   Previous INR:  1.5 on  12/21/2009 Previous Coumadin Dose(mg):  Patient took 10mg  x 2  on  12/21/2009   NEW REGIMEN & LAB RESULTS Current INR: 1.7 Current Coumadin Dose(mg): 10mg  M/W/F/Sun, 7.5mg  all other days  Regimen: 10mg  daily  Provider: Hopper,William      Repeat testing in: 1 week MEDICATIONS * MULTIVITAMIN  * FISH OIL  * DHEA  * TRIBILUS  RAMIPRIL 2.5 MG  CAPS (RAMIPRIL) Take one capsule daily ZANTAC 150 MAXIMUM STRENGTH 150 MG  TABS (RANITIDINE HCL) 1 bid * AMLODIPINE BESYLATE 2.5  MG TABS (AMLODIPINE BESYLATE) 1 once daily WARFARIN SODIUM 5 MG TABS (WARFARIN SODIUM) 1 1/2 M W F Sun once daily after 5 pm as per instructions WARFARIN SODIUM 10 MG TABS (WARFARIN SODIUM) once daily on T. Th. Sat   Anticoagulation Visit Questionnaire      Coumadin dose missed/changed:  No      Abnormal Bleeding Symptoms:  No   Any diet changes including alcohol intake, vegetables or greens  since the last visit:  No Any illnesses or hospitalizations since the last visit:  No Any signs of clotting since the last visit (including chest discomfort, dizziness, shortness of breath, arm tingling, slurred speech, swelling or redness in leg):  No

## 2010-06-13 NOTE — Progress Notes (Signed)
 Summary: refill - dr hopper  Phone Note Call from Patient Call back at Home Phone [phone]   Caller: Patient Summary of Call: patient needs 30 day supply of altace  2.5 mg once a day [facility] [address]  ----- his mail order wont arrive in time Initial call taken by: Sheryle [place],  May 31, 2008 8:44 AM  Follow-up for Phone Call        spoke withpt wofe to [facility] [address], per wifwe [facility] [place] Follow-up by: Olla Bevels,  May 31, 2008 9:05 AM      Prescriptions: RAMIPRIL  2.5 MG  CAPS (RAMIPRIL ) Take one capsule daily  #30 x 0   Entered by:   Olla Bevels   Authorized by:   Loetta Ringer MD   Signed by:   Olla Bevels on 05/31/2008   Method used:   Faxed to ...       [phone]* (retail)       [address] [place], [place]  [address]       Ph: [phone] or [phone]       Fax: [phone]   RxID:   [id number]

## 2010-06-13 NOTE — Assessment & Plan Note (Signed)
 Summary: CPX//TL   Vital Signs:  Patient Profile:   55 Years Old Male Weight:      168.50 pounds Pulse rate:   60 / minute BP sitting:   110 / 62  (left arm) Cuff size:   large  Pt. in pain?   no  Vitals Entered By: Scarlette Currier (November 21, 2006 8:57 AM)                 Chief Complaint:  cpx.  General Medical HPI:        Currently he is doing well without any significant new complaints.  Current Allergies (reviewed today): ! * DETROL LA Updated/Current Medications (including changes made in today's visit):  ALTACE  2.5 MG  CAPS (RAMIPRIL ) 1 by mouth qd ADULT ASPIRIN EC LOW STRENGTH 81 MG  TBEC (ASPIRIN) 1 by mouth qd * MULTIVITAMIN  * FISH OIL  * DHEA  * TRIBILUS    Past Surgical History:    extensive cardiac hx ; MI/ stent 2005;1999 SBO S/P resection scar tissue; 1992 exploratory lap;laryngoplasty 2005; colonoscopy neg 2007; septoplasty   Family History:    Father: Parkinson's    Mother: breast CA    Siblings: neg   Risk Factors:  Tobacco use:  never Alcohol use:  yes    Type:  rare Exercise:  yes    Type:  CVE 4-5 hrs/week w/o C-P sx   Review of Systems  General      Denies chills, fatigue, fever, loss of appetite, malaise, sleep disorder, sweats, weakness, and weight loss.  Eyes      Denies blurring, discharge, double vision, eye irritation, eye pain, halos, itching, light sensitivity, red eye, vision loss-1 eye, and vision loss-both eyes.  ENT      Complains of nasal congestion.      Denies decreased hearing, difficulty swallowing, ear discharge, earache, hoarseness, nosebleeds, postnasal drainage, ringing in ears, sinus pressure, and sore throat.  CV      Complains of bluish discoloration of lips or nails.      Denies chest pain or discomfort, difficulty breathing at night, difficulty breathing while lying down, fainting, fatigue, leg cramps with exertion, lightheadness, near fainting, palpitations, shortness of breath with exertion, swelling  of feet, swelling of hands, and weight gain.      occa sl blue to nails; no Raynauds  Resp      Denies chest discomfort, chest pain with inspiration, cough, excessive snoring, hypersomnolence, pleuritic, shortness of breath, sputum productive, and wheezing.  GI      Denies abdominal pain, bloody stools, change in bowel habits, constipation, dark tarry stools, diarrhea, excessive appetite, gas, hemorrhoids, indigestion, loss of appetite, nausea, and vomiting.  GU      Complains of decreased libido.      Denies discharge, dysuria, erectile dysfunction, hematuria, incontinence, nocturia, urinary frequency, and urinary hesitancy.      polyuria ?  MS      Complains of muscle weakness.      Denies joint pain, joint redness, joint swelling, loss of strength, low back pain, mid back pain, muscle aches, muscle , cramps, stiffness, and thoracic pain.      ? no increase muscle mass with lifting  Derm      Denies changes in color of skin, changes in nail beds, dryness, excessive perspiration, flushing, hair loss, itching, lesion(s), poor wound healing, and rash.  Neuro      Denies brief paralysis, difficulty with concentration, disturbances in coordination, falling down, headaches, inability  to speak, memory loss, numbness, poor balance, seizures, sensation of room spinning, tingling, tremors, visual disturbances, and weakness.  Psych      Complains of anxiety.      Denies depression, easily angered, easily tearful, and irritability.      job stress  Endo      Complains of cold intolerance.      Denies excessive hunger, excessive thirst, excessive urination, heat intolerance, polyuria, and weight change.  Heme      Denies abnormal bruising and bleeding.  Allergy      Denies hives or rash, itching eyes, persistent infections, seasonal allergies, and sneezing.   Physical Exam  General:     underweight appearing.   Head:     Normocephalic and atraumatic without obvious abnormalities.  No apparent alopecia or balding. Eyes:     sl art narrowing Ears:     External ear exam shows no significant lesions or deformities.  Otoscopic examination reveals clear canals, tympanic membranes are intact bilaterally without bulging, retraction, inflammation or discharge. Hearing is grossly normal bilaterally. Nose:     dry nares Mouth:     Oral mucosa and oropharynx without lesions or exudates.  Teeth in good repair. Neck:     No deformities, masses, or tenderness noted. Chest Wall:     op scar L Lungs:     Normal respiratory effort, chest expands symmetrically. Lungs are clear to auscultation, no crackles or wheezes. Heart:     S4 vs click @ apex Abdomen:     Bowel sounds positive,abdomen soft and non-tender without masses, organomegaly or hernias noted. epigastric op scar Rectal:     No external abnormalities noted. Normal sphincter tone. No rectal masses or tenderness.FOB neg Genitalia:     varicocoele Prostate:     no nodules and 1+ enlarged.   Msk:     No deformity or scoliosis noted of thoracic or lumbar spine.   Pulses:     R and L carotid,radial,femoral,dorsalis pedis and posterior tibial pulses are full and equal bilaterally Extremities:     No clubbing, cyanosis, edema, or deformity noted with normal full range of motion of all joints.   Neurologic:     alert & oriented X3, strength normal in all extremities, gait normal, and DTRs symmetrical and normal.   Skin:     Intact without suspicious lesions or rashes Cervical Nodes:     No lymphadenopathy noted Axillary Nodes:     No palpable lymphadenopathy Psych:     Oriented X3, memory intact for recent and remote, normally interactive, good eye contact, not anxious appearing, and not depressed appearing.      Impression & Recommendations:  Problem # 1:  EXAMINATION, ROUTINE MEDICAL (ICD-V70.0)  Orders: EKG w/ Interpretation (93000) Venipuncture (16109) TLB-Lipid Panel (80061-LIPID) TLB-BMP (Basic  Metabolic Panel-BMET) (80048-METABOL) TLB-CBC Platelet - w/Differential (85025-CBCD) TLB-Hepatic/Liver Function Pnl (80076-HEPATIC) TLB-TSH (Thyroid Stimulating Hormone) (84443-TSH) TLB-A1C / Hgb A1C (Glycohemoglobin) (83036-A1C) TLB-PSA (Prostate Specific Antigen) (84153-PSA) TLB-Udip ONLY (81003-UDIP)   Problem # 2:  SYMPTOM, FREQUENCY, URINARY (ICD-788.41)  Orders: TLB-A1C / Hgb A1C (Glycohemoglobin) (83036-A1C)   Problem # 3:  WEAKNESS, MUSCLE (ICD-728.87)  Problem # 4:  DECREASED LIBIDO (ICD-799.81)  Orders: TLB-Testosterone, Total (84403-TESTO)   Medications Added to Medication List This Visit: 1)  Altace  2.5 Mg Caps (Ramipril ) .Aaron Aas.. 1 by mouth qd 2)  Adult Aspirin Ec Low Strength 81 Mg Tbec (Aspirin) .Aaron Aas.. 1 by mouth qd 3)  Multivitamin  4)  Fish Oil  5)  Dhea  6)  Tribilus      Prescriptions: ALTACE  2.5 MG  CAPS (RAMIPRIL ) 1 by mouth qd  #90 x 3   Entered and Authorized by:   Loetta Ringer MD   Signed by:   Loetta Ringer MD on 11/21/2006   Method used:   Print then Give to Patient   RxID:   [id number]       Appended Document: Orders Update    Clinical Lists Changes  Observations: Added new observation of COMMENTS: ..................................................................Aaron AasRoberts Ching  November 21, 2006 2:47 PM (11/21/2006 14:47) Added new observation of PH URINE: 7.0  (11/21/2006 14:47) Added new observation of SPEC GR URIN: 1.015  (11/21/2006 14:47) Added new observation of WBC DIPSTK U: negative  (11/21/2006 14:47) Added new observation of NITRITE URN: negative  (11/21/2006 14:47) Added new observation of UROBILINOGEN: negative  (11/21/2006 14:47) Added new observation of PROTEIN, URN: negative  (11/21/2006 14:47) Added new observation of BLOOD UR DIP: negative  (11/21/2006 14:47) Added new observation of KETONES URN: negative  (11/21/2006 14:47) Added new observation of BILIRUBIN UR: negative  (11/21/2006 14:47) Added new  observation of GLUCOSE, URN: negative  (11/21/2006 14:47)      Laboratory Results   Urine Tests    Routine Urinalysis   Glucose: negative   (Normal Range: Negative) Bilirubin: negative   (Normal Range: Negative) Ketone: negative   (Normal Range: Negative) Spec. Gravity: 1.015   (Normal Range: 1.003-1.035) Blood: negative   (Normal Range: Negative) pH: 7.0   (Normal Range: 5.0-8.0) Protein: negative   (Normal Range: Negative) Urobilinogen: negative   (Normal Range: 0-1) Nitrite: negative   (Normal Range: Negative) Leukocyte Esterace: negative   (Normal Range: Negative)    Comments: ..................................................................Aaron AasRoberts Ching  November 21, 2006 2:47 PM

## 2010-06-13 NOTE — Assessment & Plan Note (Signed)
Summary: NP,ACHILLIES TENDINITIS BILATERAL,MC   Vital Signs:  Patient profile:   55 year old male BP sitting:   100 / 68  Vitals Entered By: Lillia Pauls CMA (October 13, 2009 2:35 PM)  History of Present Illness: Pt presents with bilateral achilles tendinitis that he has had intermittently for 20 years with the right being worse than the left. He used to be a runner but had to stop because of recurrent achilles problems. He denies any injuries that he is aware of from 20 years ago. He has been to a podiatrist in the past and done phyiscal therapy including calf raises and stretches. He usually feels a dull ache in the back of his heel but develops sharp pains with running or high impact activities. Medications have not helped. He has not iced regularly.   Allergies: 1)  ! * Detrol La  Physical Exam  General:  alert and well-developed.   Head:  normocephalic and atraumatic.   Ears:  Normal hearing Mouth:  MMM Neck:  supple.   Lungs:  normal respiratory effort.   Msk:  Right Foot: Pes cavus, wide forefoot + TTP over achilles insertion with nodules palpated No TTP over medial and lateral maleoli or calcaneus Neg calcaneal squeeze test Full ROM of ankle without pain 5/5 strength with resisted ankle ROM testing  Left Foot: Pes cavus, wide forefoot Mild + TTP over achilles insertion without nodules No TTP over medial and lateral maleoli or calcaneus Neg calcaneal squeeze test Full ROM of ankle without pain 5/5 strength with resisted ankle ROM testing   Additional Exam:  U/S of his right achilles tendon shows a thickness of 0.67cm with edema and a small tear at the insertion. Neovessels appreciated and increased Doppler flow. U/S of his left achilles tendon shows a thickness of 0.50cm without edema, tears or increased Doppler flow. Images saved for documentation.    Impression & Recommendations:  Problem # 1:  ACHILLES TENDINITIS, BILATERAL (ICD-726.71) Assessment New  1.  Given sports insoles with heel lifts. Patient was not sure if he could tolerated the heel lifts so I encouraged him to try them for a few days. If they were not comfortable then, he could bring them back and we could grind them down or he could simply take the lifts off.  2. Started on 1/4 patch of nitroglycerin patch to his right achilles tendon Q 24 hours. Discussed possible side effects including headaches and lowered blood pressure. OK to take ibuprofen or tylenol for headaches. Rotate patch site to decrease skin irritation. 3. Given a handout with multiple calf stretches and strengthening exercises. 4. Ice heels for 10 minutes daily in ice water.  5. Return in 3-4 weeks  Orders: [place] LIMITED ([id number]) Sports Insoles [phone])  Complete Medication List: 1)  Adult Aspirin Ec Low Strength 81 Mg Tbec (Aspirin) .Marland Kitchen.. 1 by mouth qd 2)  Multivitamin  3)  Fish Oil  4)  Dhea  5)  Tribilus  6)  Ramipril 2.5 Mg Caps (Ramipril) .... Take one capsule daily 7)  Zantac 150 Maximum Strength 150 Mg Tabs (Ranitidine hcl) .Marland Kitchen.. 1 bid 8)  Amlodipine Besylate 2.5 Mg Tabs (amlodipine Besylate)  .Marland Kitchen.. 1 once daily 9)  Nitroglycerin 0.2 Mg/hr Pt24 (Nitroglycerin) .... 1/4 patch applied to right heel every 24 hours Prescriptions: NITROGLYCERIN 0.2 MG/HR PT24 (NITROGLYCERIN) 1/4 patch applied to right heel every 24 hours  #10 x 2   Entered and Authorized by:   Jannifer Rodney MD   Signed  by:   Jannifer Rodney MD on 10/13/2009   Method used:   [facility] to        [facility]  [phone]* (retail)       [address] [place], [place]  [address]       Ph: [phone] or [phone]       Fax: [phone]   RxID:   [id number]

## 2010-06-13 NOTE — Medication Information (Signed)
 Summary: Possible Nonadherence with Ramipril /[facility]  Possible Nonadherence with Ramipril /[facility]   Imported By: Arlon Lamb 01/24/2009 10:48:07  _____________________________________________________________________  External Attachment:    Type:   Image     Comment:   External Document

## 2010-06-13 NOTE — Assessment & Plan Note (Signed)
Summary: ELEVATED BP, PT WILL BRING IN CUFF//FD   Vital Signs:  Patient profile:   55 year old male Weight:      201.8 pounds O2 Sat:      96 % Temp:     97.9 degrees F oral Pulse rate:   72 / minute Resp:     15 per minute BP sitting:   124 / 80  (left arm) Cuff size:   large  Vitals Entered By: Shonna Chock (Sep 21, 2009 3:59 PM) CC: B/P concerns and SOB, Hypertension Management Comments Patient's Cuff/ Left Arm: 119/87, P:80  -  Right Arm: 130/88, P:80                           CC:  B/P concerns and SOB and Hypertension Management.  History of Present Illness: BP up to 119- 153/ 88-93 @  home w/o trigger except  work stresses.No increased salt in diet.  Hypertension History:      He complains of dyspnea with exertion, but denies headache, chest pain, palpitations, orthopnea, PND, peripheral edema, visual symptoms, neurologic problems, syncope, and side effects from treatment.  DOE with hills.        Positive major cardiovascular risk factors include male age 79 years old or older and hypertension.  Negative major cardiovascular risk factors include non-tobacco-user status.        Positive history for target organ damage include ASHD (either angina/prior MI/prior CABG).     Allergies: 1)  ! * Detrol La  Review of Systems CV:  Denies leg cramps with exertion. MS:  Achilles tendonitis affects ability to exercise. Neuro:  Denies numbness and tingling. Psych:  Complains of anxiety; denies easily angered, easily tearful, and irritability.  Physical Exam  General:  Thin but well-nourished; alert,appropriate and cooperative throughout examination Lungs:  Normal respiratory effort, chest expands symmetrically. Lungs are clear to auscultation, no crackles or wheezes. Heart:  Normal rate and regular rhythm. S1 and S2 normal without gallop, murmur, click, rub .S4 Abdomen:  Bowel sounds positive,abdomen soft and non-tender without masses, organomegaly or hernias noted. No AAA or  bruits Pulses:  R and L carotid,radial,dorsalis pedis and posterior tibial pulses are full and equal bilaterally Extremities:  No clubbing, cyanosis, edema.   Impression & Recommendations:  Problem # 1:  HYPERTENSION (ICD-401.9) Repeat BP 115/74 L; 120/82 on R His updated medication list for this problem includes:    Ramipril 2.5 Mg Caps (Ramipril) .Marland Kitchen... Take one capsule daily  Problem # 2:  ACHILLES TENDINITIS, BILATERAL (ICD-726.71)  Orders: Misc. Referral (Misc. Ref)  Complete Medication List: 1)  Adult Aspirin Ec Low Strength 81 Mg Tbec (Aspirin) .Marland Kitchen.. 1 by mouth qd 2)  Multivitamin  3)  Fish Oil  4)  Dhea  5)  Tribilus  6)  Ramipril 2.5 Mg Caps (Ramipril) .... Take one capsule daily 7)  Zantac 150 Maximum Strength 150 Mg Tabs (Ranitidine hcl) .Marland Kitchen.. 1 bid 8)  Amlodipine Besylate 2.5 Mg Tabs (amlodipine Besylate)  .Marland Kitchen.. 1 once daily  Hypertension Assessment/Plan:      The patient's hypertensive risk group is category C: Target organ damage and/or diabetes.  Today's blood pressure is 124/80.    Patient Instructions: 1)  Check your Blood Pressure regularly. If it is above: 135/85 ON AVERAGE  you should make an appointment. Prescriptions: ZANTAC 150 MAXIMUM STRENGTH 150 MG  TABS (RANITIDINE HCL) 1 bid  #180 Tablet x 1   Entered and Authorized  by:   Marga Melnick MD   Signed by:   Marga Melnick MD on 09/21/2009   Method used:   Print then Give to Patient   RxID:   [id number] AMLODIPINE BESYLATE 2.5  MG TABS (AMLODIPINE BESYLATE) 1 once daily  #90 x 1   Entered and Authorized by:   Marga Melnick MD   Signed by:   Marga Melnick MD on 09/21/2009   Method used:   Print then Give to Patient   RxID:   [id number] RAMIPRIL 2.5 MG  CAPS (RAMIPRIL) Take one capsule daily  #90 x 3   Entered and Authorized by:   Marga Melnick MD   Signed by:   Marga Melnick MD on 09/21/2009   Method used:   Print then Give to Patient   RxID:   [id number]

## 2010-06-13 NOTE — Assessment & Plan Note (Signed)
Summary: REMOVE STITCHES---PH///SPH--WILL ARRIVE AT 4:30PM//SPH   Vital Signs:  Patient profile:   55 year old male Pulse rate:   60 / minute Resp:     14 per minute BP sitting:   120 / 78  (left arm) Cuff size:   large  Vitals Entered By: Shonna Chock CMA (December 09, 2009 4:47 PM) CC: Remove 7 stitches placed by PA @ [facility]   CC:  Remove 7 stitches placed by PA @ [facility].  History of Present Illness: 7 sutures removed from forehead w/o complication. No clinical cellulitis present. I reinforced need to have PT/INR done in 5-7 days & Faxed from [place] where he will be vacationing. I did not submit charge for suture removal as this is his 3rd visit in past 3 days.  Allergies: 1)  ! * Detrol La   Complete Medication List: 1)  Adult Aspirin Ec Low Strength 81 Mg Tbec (Aspirin) .Marland Kitchen.. 1 by mouth qd 2)  Multivitamin  3)  Fish Oil  4)  Dhea  5)  Tribilus  6)  Ramipril 2.5 Mg Caps (Ramipril) .... Take one capsule daily 7)  Zantac 150 Maximum Strength 150 Mg Tabs (Ranitidine hcl) .Marland Kitchen.. 1 bid 8)  Amlodipine Besylate 2.5 Mg Tabs (amlodipine Besylate)  .Marland Kitchen.. 1 once daily 9)  Nitroglycerin 0.2 Mg/hr Pt24 (Nitroglycerin) .... 1/4 patch applied to right heel every 24 hours 10)  Arthrotec 50-200 Mg-mcg Tabs (Diclofenac-misoprostol) .Marland Kitchen.. 1 qid until knee pain & swelling gone 11)  Warfarin Sodium 5 Mg Tabs (Warfarin sodium) .... Once daily after 5 pm as per instructions

## 2010-06-13 NOTE — Progress Notes (Signed)
Summary: Refill Request  Phone Note Refill Request Message from:  Pharmacy on [facility] Fax #: [phone]  Refills Requested: Medication #1:  RAMIPRIL 2.5 MG  CAPS Take one capsule daily   Dosage confirmed as above?Dosage Confirmed   Supply Requested: 3 months Initial call taken by: Harold Barban,  July 08, 2009 3:53 PM  Follow-up for Phone Call        pt has pending OV 08-05-09............Marland KitchenFelecia Deloach CMA  July 08, 2009 5:07 PM     Prescriptions: RAMIPRIL 2.5 MG  CAPS (RAMIPRIL) Take one capsule daily  #30 x 0   Entered by:   Jeremy Johann CMA   Authorized by:   Marga Melnick MD   Signed by:   Jeremy Johann CMA on 07/08/2009   Method used:   Faxed to ...       [facility]  [phone]* (retail)       [address] [place], [place]  [address]       Ph: [phone] or [phone]       Fax: [phone]   RxID:   [id number]
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
## 2010-06-13 NOTE — Letter (Signed)
 Summary: Results Follow up Letter  [facility]  [address] [place], [place] [address]   Phone: [phone]  Fax: [phone]    11/26/2006 MRN: [id number]   [facility]X  [facility] [address] [place], [place]  [address]  Dear Mr. REPETTI,  The following are the results of your recent test(s):  Test         Result    Pap Smear:        Normal _____  Not Normal _____ Comments: ______________________________________________________ Cholesterol: LDL(Bad cholesterol):         Your goal is less than:         HDL (Good cholesterol):       Your goal is more than: Comments:  ______________________________________________________ Mammogram:        Normal _____  Not Normal _____ Comments:  ___________________________________________________________________ Hemoccult:        Normal _____  Not normal _______ Comments:    _____________________________________________________________________ Other Tests:  Please see attached results and comments   We routinely do not discuss normal results over the telephone.  If you desire a copy of the results, or you have any questions about this information we can discuss them at your next office visit.   Sincerely,

## 2010-06-13 NOTE — Miscellaneous (Signed)
Summary: Orders Update  Clinical Lists Changes  Medications: Added new medication of PRAVASTATIN SODIUM 40 MG  TABS (PRAVASTATIN SODIUM) 1 at bedtime - Signed Rx of PRAVASTATIN SODIUM 40 MG  TABS (PRAVASTATIN SODIUM) 1 at bedtime;  #90 x 0;  Signed;  Entered by: Marga Melnick MD;  Authorized by: Marga Melnick MD;  Method used: Print then Give to Patient    Prescriptions: PRAVASTATIN SODIUM 40 MG  TABS (PRAVASTATIN SODIUM) 1 at bedtime  #90 x 0   Entered and Authorized by:   Marga Melnick MD   Signed by:   Marga Melnick MD on 11/26/2006   Method used:   Print then Give to Patient   RxID:   [id number]

## 2010-06-13 NOTE — Progress Notes (Signed)
Summary: BP still elevated  Phone Note Call from Patient Call back at [phone]   Caller: Patient Summary of Call: PT C/O ELEVATED BP. PT increase med to ramipril 5 mg once daily but BP still remain 134/92. Pt denies any headaches, dizziness,nausea, numbness, tingling or chest pain. Pt would also like to be referred to cardio. pt prefers to see dr Riley Kill.pt has pending OV schedule for 09-21-09 and will bring BP cuff. pls advise on BP................Marland KitchenFelecia Deloach CMA  Sep 19, 2009 3:24 PM   Follow-up for Phone Call        per dr Ordell Prichett add amlodipine 5 mg 1/2 tab once daily...Marland KitchenMarland KitchenFelecia Deloach CMA  Sep 19, 2009 4:01 PM   Pt aware rx sent to pharmacy.Marland KitchenMarland KitchenFelecia Deloach CMA  Sep 19, 2009 4:04 PM     New/Updated Medications: AMLODIPINE BESYLATE 5 MG TABS (AMLODIPINE BESYLATE) Take 1/2 tab once daily Prescriptions: AMLODIPINE BESYLATE 5 MG TABS (AMLODIPINE BESYLATE) Take 1/2 tab once daily  #15 x 0   Entered by:   Jeremy Johann CMA   Authorized by:   Marga Melnick MD   Signed by:   Jeremy Johann CMA on 09/19/2009   Method used:   Faxed to ...       [facility]  [phone]* (retail)       [address] [place], [place]  [address]       Ph: [phone] or [phone]       Fax: [phone]   RxID:   [id number]

## 2010-06-13 NOTE — Progress Notes (Signed)
Summary: Vertigo/Warfarin  Phone Note Call from Patient Call back at [phone]   Summary of Call: Patient called noting that this weekend he had a lot of trouble with Vertigo. He notes that his Warfarin possibly made it seen worse but he can not rule out is being due to sinus issues. He would like to know if he should decrease the warfarin a little.  Please advise. Initial call taken by: Lucious Groves CMA,  March 27, 2010 11:11 AM  Follow-up for Phone Call        PT/INR was below  therapeutic  goal @ 2.1 on 11/11. Goal = 2.5-3.5. The coumadin should not be changed w/o rechecking the PT/INR because of  risk of recurring DVT & pulmonary emboli Follow-up by: Marga Melnick MD,  March 27, 2010 12:47 PM  Additional Follow-up for Phone Call Additional follow up Details #1::        Patient notes that he does not want to re-check this soon and really does not wish to be seen. He does note that it could be sinus related and I made him aware that we cannot rule out it being related to coumadin. Patient is aware that if it continues he will need office visit, and he will call back if appt is needed. Additional Follow-up by: Lucious Groves CMA,  March 27, 2010 1:24 PM

## 2010-06-13 NOTE — Progress Notes (Signed)
Summary: Leg Pain  Phone Note Call from Patient Call back at Home Phone [phone]   Caller: Patient Summary of Call: Patient called to see if he could be seen today for his leg pain (patient with pending appointment tomorrow), I informed patient due to the time of his call and Dr.Hopper with two more patient's I would recommend that he go to [facility] (10 min up the street) patient refused and said " Im not going to one of those places" I advised if his leg pain is to the point where he feels he should be seen right away we highly recommend he seek medical attention at [facility], Urgent Care of the closest Emergency Room, patient refused.   Patient with pending appointment to see Dr.Hopper tomorrow./Chrae [facility] CMA  December 06, 2009 5:11 PM   Dr.Hopper was informed of this situation and agreed with what was recommended for patient./Chrae [facility] CMA  December 06, 2009 5:16 PM

## 2010-06-13 NOTE — Progress Notes (Signed)
Summary: RX NEED TODAY  Phone Note Call from Patient Call back at [phone]   Caller: Patient Summary of Call: WARFIN 5 MG [facility] Initial call taken by: Freddy Jaksch,  December 27, 2009 8:57 AM  Follow-up for Phone Call        rx sent to [facility]-pt informed Follow-up by: Brenton Grills MA,  December 27, 2009 10:08 AM

## 2010-06-15 NOTE — Medication Information (Signed)
Summary: Care Consideration Regarding Adding a Statin/[employer]  Care Consideration Regarding Adding a Statin/[employer]   Imported By: Lanelle Bal 04/24/2010 14:20:45  _____________________________________________________________________  External Attachment:    Type:   Image     Comment:   External Document

## 2010-06-15 NOTE — Assessment & Plan Note (Signed)
Summary: pt check///sph  Nurse Visit   Vital Signs:  Patient profile:   55 year old male Height:      75.5 inches (191.77 cm) Weight:      206.50 pounds (93.86 kg) BMI:     25.56 BP sitting:   110 / 60  (left arm)  Vitals Entered By: Lucious Groves CMA (June 02, 2010 2:26 PM) CC: PT check./kb   Allergies: 1)  ! * Detrol La Laboratory Results   Blood Tests      INR: 2.5   (Normal Range: 0.88-1.12   Therap INR: 2.0-3.5)    Orders Added: 1)  Est. Patient Level I [[address]] 2)  Protime [60454UJ]   ANTICOAGULATION RECORD PREVIOUS REGIMEN & LAB RESULTS   Previous INR:  2.1 on  03/24/2010 Previous Coumadin Dose(mg):  10 mg qd on  03/24/2010 Previous Regimen:  10mg  daily on  02/10/2010  NEW REGIMEN & LAB RESULTS Anticoag. Dx: Deep venous thrombosis Current INR: 2.5 Current Coumadin Dose(mg): 10mg  qd EXCEPT 15 on Wed. only Regimen: same  Provider: Alwyn Ren Repeat testing in: 4 weeks Dose has been reviewed with patient or caretaker during this visit. Reviewed by: Lucious Groves

## 2010-06-15 NOTE — Progress Notes (Signed)
Summary: has PT appt for end of week  Phone Note Call from Patient   Caller: Patient Summary of Call: per message from Chrae, pt has PT appt for Friday 1/20 at 2:00---cant come earlier in week--I left message for patient that his prescription for Warfarin [facility] Initial call taken by: Jerolyn Shin,  May 29, 2010 11:04 AM

## 2010-06-30 ENCOUNTER — Ambulatory Visit (INDEPENDENT_AMBULATORY_CARE_PROVIDER_SITE_OTHER): Payer: 59

## 2010-06-30 ENCOUNTER — Ambulatory Visit: Payer: Self-pay

## 2010-06-30 ENCOUNTER — Encounter: Payer: Self-pay | Admitting: Internal Medicine

## 2010-06-30 DIAGNOSIS — Z7901 Long term (current) use of anticoagulants: Secondary | ICD-10-CM

## 2010-06-30 DIAGNOSIS — I82409 Acute embolism and thrombosis of unspecified deep veins of unspecified lower extremity: Secondary | ICD-10-CM

## 2010-07-03 ENCOUNTER — Encounter: Payer: Self-pay | Admitting: Internal Medicine

## 2010-07-05 NOTE — Medication Information (Signed)
Summary: PT CHECK//PH  Anticoagulant Therapy  PCP: Marga Melnick Indication 1: Deep venous thrombosis  Vital Signs: Weight: 208 lbs.     Dietary changes: no    Health status changes: no    Bleeding/hemorrhagic complications: no    Recent/future hospitalizations: no    Any changes in medication regimen? no    Recent/future dental: no  Any missed doses?: no       Is patient compliant with meds? yes      Comments: Patient notes that it has been over 6 months since his last check for DVT, he has 10mg  tabs and takes 10mg  everyday EXCEPT 15mg  on Wed.  **He has not had 5mg  tabs for sometime, but the 5mg  tabs are left on med list in case refills are needed.  **Per MD--no change in meds and re-check in 4 weeks. We will also enter referral for another venous doppler. Patient will be called by [facility] for appt. Lucious Groves CMA  June 30, 2010 3:24 PM   Allergies: 1)  ! * Detrol La  Anticoagulation Management History:      Negative risk factors for bleeding include an age less than 66 years old.  The bleeding index is 'low risk'.  Positive CHADS2 values include History of HTN.  Negative CHADS2 values include Age > 76 years old.  His last INR was 2.5.    Anticoagulation Management Assessment/Plan:      The patient's current anticoagulation dose is Warfarin sodium 5 mg tabs: as directed based on PT Check, Warfarin sodium 10 mg tabs: as directed based on PT check.  The next INR is due 4 weeks.          Appended Document: Nurse Visit (Infectious Disease)      Vital Signs:  Patient profile:   55 year old male Height:      75.5 inches Weight:      208 pounds BMI:     25.75 Temp:     98.0 degrees F oral BP sitting:   112 / 60  (left arm) Cuff size:   large Prior Medications: MULTIVITAMIN ()  FISH OIL ()  DHEA ()  TRIBILUS ()  RAMIPRIL 2.5 MG  CAPS (RAMIPRIL) Take one capsule daily AMLODIPINE BESYLATE 2.5 MG TABS (AMLODIPINE BESYLATE) take one tablet daily WARFARIN SODIUM 5 MG  TABS (WARFARIN SODIUM) as directed based on PT Check WARFARIN SODIUM 10 MG TABS (WARFARIN SODIUM) as directed based on PT check OMEPRAZOLE 20 MG CPDR (OMEPRAZOLE) 1 pill 30 min pre b'fast Current Allergies: ! * DETROL LA Vitals were entered after sheet was returned by MD, will continue with venous doppler per  MD. Lucious Groves CMA  June 30, 2010 4:00 PM

## 2010-07-11 NOTE — Miscellaneous (Signed)
Summary: Orders Update  Clinical Lists Changes  Orders: Added new Test order of Venous Duplex Lower Extremity (Venous Duplex Lower) - Signed 

## 2010-07-29 ENCOUNTER — Encounter: Payer: Self-pay | Admitting: Internal Medicine

## 2010-07-29 LAB — BASIC METABOLIC PANEL WITH GFR
BUN: 10 mg/dL (ref 6–23)
CO2: 27 meq/L (ref 19–32)
Calcium: 8.6 mg/dL (ref 8.4–10.5)
Chloride: 103 meq/L (ref 96–112)
GFR calc non Af Amer: >60 mL/min (ref >60)
Glucose, Bld: 91 mg/dL (ref 70–99)
Sodium: 135 meq/L (ref 135–145)

## 2010-07-29 LAB — BASIC METABOLIC PANEL
Creatinine, Ser: 1 mg/dL (ref 0.4–1.5)
GFR calc Af Amer: >60 mL/min (ref >60)
Potassium: 3.8 mEq/L (ref 3.5–5.1)

## 2010-07-29 LAB — CBC
HCT: 35.6 % — ABNORMAL LOW (ref 39.0–52.0)
Hemoglobin: 12.2 g/dL — ABNORMAL LOW (ref 13.0–17.0)
MCH: 31.9 pg (ref 26.0–34.0)
MCHC: 34.3 g/dL (ref 30.0–36.0)
MCV: 92.8 fL (ref 78.0–100.0)
Platelets: 145 10*3/uL — ABNORMAL LOW (ref 150–400)
RBC: 3.83 MIL/uL — ABNORMAL LOW (ref 4.22–5.81)
RDW: 13.6 % (ref 11.5–15.5)
WBC: 6.8 10*3/uL (ref 4.0–10.5)

## 2010-07-29 LAB — DIFFERENTIAL
Basophils Absolute: 0 10*3/uL (ref 0.0–0.1)
Basophils Relative: 0 % (ref 0–1)
Eosinophils Absolute: 0.2 10*3/uL (ref 0.0–0.7)
Eosinophils Relative: 3 % (ref 0–5)
Lymphocytes Relative: 19 % (ref 12–46)
Lymphs Abs: 1.3 10*3/uL (ref 0.7–4.0)
Monocytes Absolute: 0.4 10*3/uL (ref 0.1–1.0)
Monocytes Relative: 6 % (ref 3–12)
Neutro Abs: 4.9 10*3/uL (ref 1.7–7.7)
Neutrophils Relative %: 72 % (ref 43–77)

## 2010-07-29 LAB — PROTIME-INR
INR: 1.12 (ref 0.00–1.49)
Prothrombin Time: 14.3 s (ref 11.6–15.2)

## 2010-07-29 LAB — APTT: aPTT: 32 seconds (ref 24–37)

## 2010-08-11 ENCOUNTER — Telehealth: Payer: Self-pay | Admitting: Internal Medicine

## 2010-08-11 NOTE — Telephone Encounter (Signed)
Please reschedule [place]

## 2010-08-11 NOTE — Telephone Encounter (Signed)
Spoke w/ pt informed that I did receive voicemail stating that he needs ultrasound scheduled. Ask pt what appt was for when looking in centricity he was supposed to go for doppler 07/03/10 but never went to appt. Tried calling to reschedule but pt was told it was expired and new referral is to be done. Pt wants this done as soon as possible informed pt that I will have referral coordinator work on referral as soon as she can and may have to redo insurance so this could take sometime. Pt stated that this was unexceptable also reminded pt that he is also overdue for pt/inr check since last check was on 06/30/10 and although he didn't have ultrasound should still have had protime checked. Spoke w/ Luster Landsberg and she will be working on referral.

## 2010-08-11 NOTE — Telephone Encounter (Signed)
Patient called to check on status of his voice message that he says he left on out phones early this morning  (per Chrae, we had no message)  He says he had a blood clot 7-8 months ago and has been on Warfarin for all that time---says he is bruising easily.    He wants an appointment for an UltraSound to check this leg

## 2010-08-14 ENCOUNTER — Other Ambulatory Visit: Payer: Self-pay | Admitting: *Deleted

## 2010-08-14 DIAGNOSIS — Z86718 Personal history of other venous thrombosis and embolism: Secondary | ICD-10-CM

## 2010-08-15 ENCOUNTER — Encounter (INDEPENDENT_AMBULATORY_CARE_PROVIDER_SITE_OTHER): Payer: 59 | Admitting: *Deleted

## 2010-08-15 ENCOUNTER — Other Ambulatory Visit: Payer: Self-pay | Admitting: *Deleted

## 2010-08-15 DIAGNOSIS — M79609 Pain in unspecified limb: Secondary | ICD-10-CM

## 2010-08-15 DIAGNOSIS — Z86718 Personal history of other venous thrombosis and embolism: Secondary | ICD-10-CM

## 2010-08-15 DIAGNOSIS — I803 Phlebitis and thrombophlebitis of lower extremities, unspecified: Secondary | ICD-10-CM

## 2010-08-21 ENCOUNTER — Encounter: Payer: Self-pay | Admitting: Internal Medicine

## 2010-08-22 ENCOUNTER — Telehealth: Payer: Self-pay

## 2010-08-22 NOTE — Telephone Encounter (Signed)
Message copied by Stephan Minister on Tue Aug 22, 2010  5:00 PM ------      Message from: Marga Melnick      Created: Mon Aug 21, 2010  6:49 PM       Unfortunately there is residual thrombus or clot present in the L popliteal vein ( behind the knee); off warfarin more likely than not there  would be progression of the clot with threat to your health over time in [place] or @ a medical center. [employer]

## 2010-08-22 NOTE — Telephone Encounter (Signed)
Left message on voicemail with results for patient to call if he would like to further discuss

## 2010-08-23 ENCOUNTER — Other Ambulatory Visit: Payer: Self-pay | Admitting: Internal Medicine

## 2010-08-23 NOTE — Telephone Encounter (Signed)
Patient called back and I re-advised on Dr.Hopper's response, Dr.Hopper was finishing up with a patient and briefly spoke with Mr.Caputo. Copy of report to be mailed to patient

## 2010-09-01 ENCOUNTER — Ambulatory Visit (INDEPENDENT_AMBULATORY_CARE_PROVIDER_SITE_OTHER): Payer: 59 | Admitting: *Deleted

## 2010-09-01 DIAGNOSIS — Z7901 Long term (current) use of anticoagulants: Secondary | ICD-10-CM

## 2010-09-01 DIAGNOSIS — I82409 Acute embolism and thrombosis of unspecified deep veins of unspecified lower extremity: Secondary | ICD-10-CM

## 2010-09-01 LAB — POCT INR: INR: 2.2

## 2010-09-01 NOTE — Patient Instructions (Signed)
Pt is aware no change recheck 4 weeks.

## 2010-09-21 ENCOUNTER — Encounter: Payer: Self-pay | Admitting: Cardiovascular Disease

## 2010-09-27 ENCOUNTER — Other Ambulatory Visit: Payer: Self-pay | Admitting: Internal Medicine
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
## 2010-10-01 ENCOUNTER — Encounter: Payer: Self-pay | Admitting: Cardiovascular Disease

## 2010-10-03 ENCOUNTER — Telehealth: Payer: Self-pay | Admitting: *Deleted

## 2010-10-03 NOTE — Telephone Encounter (Signed)
Called pt with u/s results per letter, lmom tcb. Will notify of results below. The carotid ultrasound showed 40% to 59% blockage on the left side, and 60 to 70% blockage on the right. We need to continue aggressive cholesterol management to prevent further progression.  There was also some concern about the way the thyroid looked on the previous ultrasound. A designated  thyroid ultrasound was recommended. If Dr. Candelaria Stagers has not scheduled this, please call our office and we would be happy to arrange this study.

## 2010-10-05 NOTE — Telephone Encounter (Signed)
Attempted to contact pt, LMOM TCB.  

## 2010-10-11 NOTE — Telephone Encounter (Signed)
Mailed copy to pt, unable to reach.

## 2010-10-11 NOTE — Telephone Encounter (Signed)
Attempted to contact pt, LMOM TCB.  

## 2010-10-24 ENCOUNTER — Other Ambulatory Visit: Payer: Self-pay | Admitting: Internal Medicine

## 2010-10-30 ENCOUNTER — Other Ambulatory Visit: Payer: Self-pay | Admitting: *Deleted

## 2010-10-30 MED ORDER — AMLODIPINE BESYLATE 2.5 MG PO TABS: 2.5000 mg | ORAL_TABLET | Freq: Every day | ORAL | Status: DC

## 2010-11-01 ENCOUNTER — Ambulatory Visit: Payer: 59

## 2010-11-01 ENCOUNTER — Other Ambulatory Visit: Payer: Self-pay

## 2010-11-01 ENCOUNTER — Ambulatory Visit (INDEPENDENT_AMBULATORY_CARE_PROVIDER_SITE_OTHER): Payer: 59 | Admitting: *Deleted

## 2010-11-01 DIAGNOSIS — I82409 Acute embolism and thrombosis of unspecified deep veins of unspecified lower extremity: Secondary | ICD-10-CM

## 2010-11-01 DIAGNOSIS — Z7901 Long term (current) use of anticoagulants: Secondary | ICD-10-CM

## 2010-11-01 LAB — POCT INR: INR: 2

## 2010-11-01 MED ORDER — WARFARIN SODIUM 10 MG PO TABS: 10.0000 mg | ORAL_TABLET | Freq: Every day | ORAL | Status: DC

## 2010-11-01 NOTE — Patient Instructions (Signed)
Per Hop will refer to cardiology for evaluation of peripheral vascular disease pt would like to discontinue coumadin.

## 2010-11-02 ENCOUNTER — Encounter: Payer: Self-pay | Admitting: Internal Medicine

## 2010-11-24 ENCOUNTER — Other Ambulatory Visit: Payer: Self-pay | Admitting: Internal Medicine

## 2010-11-24 DIAGNOSIS — Z7901 Long term (current) use of anticoagulants: Secondary | ICD-10-CM

## 2010-11-24 DIAGNOSIS — I82409 Acute embolism and thrombosis of unspecified deep veins of unspecified lower extremity: Secondary | ICD-10-CM

## 2010-11-27 ENCOUNTER — Other Ambulatory Visit: Payer: Self-pay | Admitting: Internal Medicine

## 2010-11-27 MED ORDER — AMLODIPINE BESYLATE 2.5 MG PO TABS: 2.5000 mg | ORAL_TABLET | Freq: Every day | ORAL | Status: DC

## 2010-11-27 NOTE — Telephone Encounter (Signed)
RX sent to pharmacy  

## 2010-12-01 ENCOUNTER — Other Ambulatory Visit: Payer: Self-pay | Admitting: Internal Medicine

## 2010-12-04 ENCOUNTER — Other Ambulatory Visit: Payer: Self-pay | Admitting: Internal Medicine

## 2010-12-04 MED ORDER — RAMIPRIL 2.5 MG PO CAPS: ORAL_CAPSULE | ORAL | Status: DC

## 2010-12-04 NOTE — Telephone Encounter (Signed)
Sent in under wrong md, resent under right md.

## 2010-12-07 ENCOUNTER — Encounter: Payer: Self-pay | Admitting: Cardiovascular Disease

## 2010-12-08 ENCOUNTER — Other Ambulatory Visit: Payer: Self-pay | Admitting: Internal Medicine

## 2010-12-08 MED ORDER — WARFARIN SODIUM 10 MG PO TABS: 10.0000 mg | ORAL_TABLET | Freq: Every day | ORAL | Status: DC

## 2010-12-08 NOTE — Telephone Encounter (Signed)
RX sent to pharmacy  

## 2010-12-11 ENCOUNTER — Other Ambulatory Visit: Payer: Self-pay | Admitting: Internal Medicine

## 2010-12-12 ENCOUNTER — Encounter: Payer: Self-pay | Admitting: Cardiovascular Disease

## 2010-12-12 ENCOUNTER — Ambulatory Visit (INDEPENDENT_AMBULATORY_CARE_PROVIDER_SITE_OTHER): Payer: 59 | Admitting: Cardiovascular Disease

## 2010-12-12 VITALS — BP 120/80 | HR 96 | Resp 18 | Ht 75.0 in | Wt 207.8 lb

## 2010-12-12 DIAGNOSIS — I82409 Acute embolism and thrombosis of unspecified deep veins of unspecified lower extremity: Secondary | ICD-10-CM

## 2010-12-12 NOTE — Assessment & Plan Note (Signed)
Left lower ext DVT, Will repeat venous doppler left leg next week. Continue coumadin for now. I will see him back in 3-4 weeks to discuss the findings and his coumadin therapy.

## 2010-12-12 NOTE — Patient Instructions (Signed)
Your physician recommends that you schedule a follow-up appointment in: 3-4 weeks with Dr. Clifton James  Your physician has requested that you have a lower  extremity venous duplex. This test is an ultrasound of the veins in the legs. It looks at venous blood flow that carries blood from the heart to the legs or arms. Allow one hour for a Lower Venous exam. Allow thirty minutes for an Upper Venous exam. There are no restrictions or special instructions. To be done on your left leg next week.
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
## 2010-12-13 ENCOUNTER — Other Ambulatory Visit: Payer: Self-pay | Admitting: Internal Medicine

## 2010-12-22 ENCOUNTER — Other Ambulatory Visit: Payer: Self-pay | Admitting: *Deleted

## 2010-12-22 ENCOUNTER — Encounter (INDEPENDENT_AMBULATORY_CARE_PROVIDER_SITE_OTHER): Payer: 59 | Admitting: *Deleted

## 2010-12-22 DIAGNOSIS — M7989 Other specified soft tissue disorders: Secondary | ICD-10-CM

## 2010-12-22 DIAGNOSIS — I87009 Postthrombotic syndrome without complications of unspecified extremity: Secondary | ICD-10-CM

## 2010-12-22 DIAGNOSIS — I82409 Acute embolism and thrombosis of unspecified deep veins of unspecified lower extremity: Secondary | ICD-10-CM

## 2010-12-25 ENCOUNTER — Other Ambulatory Visit: Payer: Self-pay | Admitting: Internal Medicine

## 2010-12-25 ENCOUNTER — Encounter: Payer: Self-pay | Admitting: Cardiovascular Disease

## 2010-12-25 NOTE — Telephone Encounter (Signed)
Ranitidine 150 mg #60 RX3

## 2010-12-25 NOTE — Telephone Encounter (Signed)
Dr.Hopper please advise: ? If patient is to take zantac and prilosec

## 2011-01-02 ENCOUNTER — Ambulatory Visit (INDEPENDENT_AMBULATORY_CARE_PROVIDER_SITE_OTHER): Payer: 59 | Admitting: *Deleted

## 2011-01-02 DIAGNOSIS — Z7901 Long term (current) use of anticoagulants: Secondary | ICD-10-CM

## 2011-01-02 DIAGNOSIS — I82409 Acute embolism and thrombosis of unspecified deep veins of unspecified lower extremity: Secondary | ICD-10-CM

## 2011-01-02 LAB — POCT INR: INR: 2.6

## 2011-01-02 NOTE — Patient Instructions (Signed)
Pt advised no change recheck 4 weeks  

## 2011-01-07 ENCOUNTER — Other Ambulatory Visit: Payer: Self-pay | Admitting: Internal Medicine

## 2011-01-11 ENCOUNTER — Ambulatory Visit: Payer: 59 | Admitting: Cardiovascular Disease

## 2011-02-09 ENCOUNTER — Ambulatory Visit: Payer: 59

## 2011-02-09 ENCOUNTER — Ambulatory Visit (INDEPENDENT_AMBULATORY_CARE_PROVIDER_SITE_OTHER): Payer: 59 | Admitting: *Deleted

## 2011-02-09 DIAGNOSIS — Z7901 Long term (current) use of anticoagulants: Secondary | ICD-10-CM

## 2011-02-09 DIAGNOSIS — I82409 Acute embolism and thrombosis of unspecified deep veins of unspecified lower extremity: Secondary | ICD-10-CM

## 2011-02-09 LAB — POCT INR: INR: 2.3

## 2011-02-09 NOTE — Patient Instructions (Signed)
Return to office in  4 weeks for PT/INR  Continue current dose: (10mg) 1 tab daily  

## 2011-02-13 ENCOUNTER — Other Ambulatory Visit: Payer: Self-pay | Admitting: Internal Medicine

## 2011-03-16 ENCOUNTER — Ambulatory Visit (INDEPENDENT_AMBULATORY_CARE_PROVIDER_SITE_OTHER): Payer: 59 | Admitting: *Deleted

## 2011-03-16 ENCOUNTER — Other Ambulatory Visit: Payer: 59

## 2011-03-16 DIAGNOSIS — I82409 Acute embolism and thrombosis of unspecified deep veins of unspecified lower extremity: Secondary | ICD-10-CM

## 2011-03-16 DIAGNOSIS — Z7901 Long term (current) use of anticoagulants: Secondary | ICD-10-CM

## 2011-03-16 LAB — POCT INR: INR: 3.5

## 2011-03-16 NOTE — Patient Instructions (Signed)
Return for INR check in 4 weeks.   Dose change to 5 mg today then 10 mg daily except for 5 mg every Wednesday.

## 2011-03-26 ENCOUNTER — Other Ambulatory Visit: Payer: Self-pay | Admitting: Internal Medicine

## 2011-03-31 ENCOUNTER — Other Ambulatory Visit: Payer: Self-pay | Admitting: Internal Medicine

## 2011-04-02 NOTE — Telephone Encounter (Signed)
Patient needs CPX  

## 2011-04-20 ENCOUNTER — Ambulatory Visit: Payer: 59

## 2011-04-20 ENCOUNTER — Ambulatory Visit (INDEPENDENT_AMBULATORY_CARE_PROVIDER_SITE_OTHER): Payer: 59 | Admitting: *Deleted

## 2011-04-20 ENCOUNTER — Encounter: Payer: Self-pay | Admitting: *Deleted

## 2011-04-20 DIAGNOSIS — I8010 Phlebitis and thrombophlebitis of unspecified femoral vein: Secondary | ICD-10-CM

## 2011-04-20 DIAGNOSIS — I82419 Acute embolism and thrombosis of unspecified femoral vein: Secondary | ICD-10-CM

## 2011-04-20 DIAGNOSIS — Z7901 Long term (current) use of anticoagulants: Secondary | ICD-10-CM

## 2011-04-20 LAB — POCT INR
INR: 2.4
INR: 2.4

## 2011-04-20 MED ORDER — SCOPOLAMINE 1 MG/3DAYS TD PT72: 1.0000 | MEDICATED_PATCH | TRANSDERMAL | Status: DC

## 2011-04-20 NOTE — Patient Instructions (Signed)
PT/INR today was 2.4 No changes to be made for medication Continue taking: 10mg  daily except on Wednesday's take 5mg  Re-Check PT/INR in 4 weeks We will send a rx for trans dermal patch  Instructions use 1 patch every 3 days

## 2011-04-27 ENCOUNTER — Other Ambulatory Visit: Payer: Self-pay | Admitting: Internal Medicine

## 2011-04-30 ENCOUNTER — Other Ambulatory Visit: Payer: Self-pay | Admitting: Internal Medicine

## 2011-05-25 ENCOUNTER — Ambulatory Visit (INDEPENDENT_AMBULATORY_CARE_PROVIDER_SITE_OTHER): Payer: 59

## 2011-05-25 DIAGNOSIS — Z23 Encounter for immunization: Secondary | ICD-10-CM

## 2011-05-25 DIAGNOSIS — Z7901 Long term (current) use of anticoagulants: Secondary | ICD-10-CM

## 2011-05-25 DIAGNOSIS — I82409 Acute embolism and thrombosis of unspecified deep veins of unspecified lower extremity: Secondary | ICD-10-CM

## 2011-05-25 LAB — POCT INR: INR: 2.6

## 2011-05-25 NOTE — Patient Instructions (Addendum)
10 mg daily except for 5 mg every Wednesday. Recheck in 4 weeks.

## 2011-06-07 ENCOUNTER — Other Ambulatory Visit: Payer: Self-pay | Admitting: Internal Medicine

## 2011-06-17 ENCOUNTER — Other Ambulatory Visit: Payer: Self-pay | Admitting: Internal Medicine

## 2011-06-18 NOTE — Telephone Encounter (Signed)
Patient is DUE for CPX

## 2011-07-06 ENCOUNTER — Ambulatory Visit: Payer: 59 | Admitting: Cardiovascular Disease

## 2011-07-06 ENCOUNTER — Other Ambulatory Visit: Payer: Self-pay | Admitting: Internal Medicine

## 2011-07-06 NOTE — Telephone Encounter (Signed)
Patient needs to schedule a CPX  

## 2011-07-30 ENCOUNTER — Other Ambulatory Visit: Payer: Self-pay | Admitting: Internal Medicine

## 2011-07-31 ENCOUNTER — Other Ambulatory Visit: Payer: Self-pay | Admitting: Internal Medicine

## 2011-07-31 ENCOUNTER — Ambulatory Visit (INDEPENDENT_AMBULATORY_CARE_PROVIDER_SITE_OTHER): Payer: 59

## 2011-07-31 VITALS — BP 126/84 | HR 84 | Resp 14 | Wt 213.6 lb

## 2011-07-31 DIAGNOSIS — Z5181 Encounter for therapeutic drug level monitoring: Secondary | ICD-10-CM

## 2011-07-31 DIAGNOSIS — Z91199 Patient's noncompliance with other medical treatment and regimen due to unspecified reason: Secondary | ICD-10-CM

## 2011-07-31 DIAGNOSIS — Z7901 Long term (current) use of anticoagulants: Secondary | ICD-10-CM

## 2011-07-31 DIAGNOSIS — Z9119 Patient's noncompliance with other medical treatment and regimen: Secondary | ICD-10-CM

## 2011-07-31 DIAGNOSIS — I82409 Acute embolism and thrombosis of unspecified deep veins of unspecified lower extremity: Secondary | ICD-10-CM

## 2011-07-31 LAB — POCT INR: INR: 1.3

## 2011-07-31 MED ORDER — WARFARIN SODIUM 10 MG PO TABS: 10.0000 mg | ORAL_TABLET | ORAL | Status: DC

## 2011-07-31 NOTE — Patient Instructions (Addendum)
Patient to discuss with Dr.Hopper.    KP

## 2011-07-31 NOTE — Progress Notes (Signed)
His PT INR is 1.3. He failed to have a prothrombin time performed in February. He realized he was running low on his warfarin last week and started taking the 10 mg every other day.  His last PT/INR was therapeutic in January of this year.  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 The lack of protection with a PT/ INR of 1.3 was also discussed.  Because of his nonadherence, which is a chronic recurrent problem, I'll have him reevaluated by Dr. Clifton James in Cardiology. If he  feels that the warfarin should  be continued long term; I'll request  that the prothrombin times monitored at the [facility] on [facility].  The risk potentially to his health and of   medical liability to me associated with his non adherence was discussed in a non-judgmental  fashion.

## 2011-08-30 ENCOUNTER — Ambulatory Visit (INDEPENDENT_AMBULATORY_CARE_PROVIDER_SITE_OTHER): Payer: 59 | Admitting: Cardiovascular Disease

## 2011-08-30 ENCOUNTER — Encounter: Payer: Self-pay | Admitting: Cardiovascular Disease

## 2011-08-30 VITALS — BP 118/80 | HR 85 | Ht 75.0 in | Wt 214.0 lb

## 2011-08-30 DIAGNOSIS — I2510 Atherosclerotic heart disease of native coronary artery without angina pectoris: Secondary | ICD-10-CM

## 2011-08-30 DIAGNOSIS — I824Z9 Acute embolism and thrombosis of unspecified deep veins of unspecified distal lower extremity: Secondary | ICD-10-CM

## 2011-08-30 DIAGNOSIS — I82409 Acute embolism and thrombosis of unspecified deep veins of unspecified lower extremity: Secondary | ICD-10-CM

## 2011-08-30 NOTE — Patient Instructions (Signed)
Your physician wants you to follow-up in: 12 months with Dr. Clifton James.  You will receive a reminder letter in the mail two months in advance. If you don't receive a letter, please call our office to schedule the follow-up appointment.  Your physician has requested that you have a left lower or upper extremity venous duplex. This test is an ultrasound of the veins in the legs or arms. It looks at venous blood flow that carries blood from the heart to the legs or arms. Allow one hour for a Lower Venous exam. Allow thirty minutes for an Upper Venous exam. There are no restrictions or special instructions.   Your physician recommends that you return for fasting lab work in: on the same day of your doppler study.
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
## 2011-08-30 NOTE — Assessment & Plan Note (Signed)
Stable. He has not been followed in our office for his cardiac issues. He needs to be on a statin. Will check lipids and LFTs. Continue other meds. If coumadin is stopped, he will need to be on an ASA.

## 2011-08-30 NOTE — Assessment & Plan Note (Signed)
Will repeat venous dopplers. If there is still small amount of chronic popliteal thrombus, will consider stopping coumadin as he has been on it for nearly two years.

## 2011-08-31 ENCOUNTER — Telehealth: Payer: Self-pay | Admitting: Internal Medicine

## 2011-08-31 NOTE — Telephone Encounter (Signed)
Patient called & stated he was going to go out of the country in June and needed a medical release to travel as well as a prescription for Cipro. I advised the patient he had no recent appointment on file and we normally do not give out medications to for travel or to patients that have not been seen with in a year. He was reluctant to make an appointment. Please review and advise & I will call him back and tell him what Dr.Hopper is willing to do  Thanks Patient PH# [phone]

## 2011-08-31 NOTE — Telephone Encounter (Signed)
Spoke with patient's wife, informed her of Dr.Hopper's response. She will share with patient and stated he will see Cardiologist next week

## 2011-08-31 NOTE — Telephone Encounter (Signed)
He needs  to clarify with cardiologist as to whether he can come off the Coumadin or not in view of the travel out of the country. If he remains  on Coumadin Cipro could dramatically raise the PT/INR. This should be correlated with Dr. Clifton James. I will send a copy this note to him as well.

## 2011-08-31 NOTE — Telephone Encounter (Signed)
Dr.Hopper please advise, patient last seen 07/03/2010

## 2011-09-03 ENCOUNTER — Other Ambulatory Visit: Payer: Self-pay | Admitting: Internal Medicine

## 2011-09-03 ENCOUNTER — Ambulatory Visit (INDEPENDENT_AMBULATORY_CARE_PROVIDER_SITE_OTHER): Payer: 59 | Admitting: *Deleted

## 2011-09-03 VITALS — BP 90/76 | HR 78 | Temp 98.3°F | Wt 214.0 lb

## 2011-09-03 DIAGNOSIS — I82409 Acute embolism and thrombosis of unspecified deep veins of unspecified lower extremity: Secondary | ICD-10-CM

## 2011-09-03 DIAGNOSIS — Z7901 Long term (current) use of anticoagulants: Secondary | ICD-10-CM

## 2011-09-03 LAB — POCT INR: INR: 2.7

## 2011-09-03 MED ORDER — WARFARIN SODIUM 10 MG PO TABS: 10.0000 mg | ORAL_TABLET | ORAL | Status: DC

## 2011-09-03 NOTE — Patient Instructions (Signed)
Return to office in 4 weeks  Continue current dose: 10 mg daily

## 2011-09-03 NOTE — Telephone Encounter (Signed)
I will be glad to discuss after his venous dopplers. cdm

## 2011-09-07 ENCOUNTER — Other Ambulatory Visit (INDEPENDENT_AMBULATORY_CARE_PROVIDER_SITE_OTHER): Payer: 59

## 2011-09-07 ENCOUNTER — Encounter (INDEPENDENT_AMBULATORY_CARE_PROVIDER_SITE_OTHER): Payer: 59

## 2011-09-07 DIAGNOSIS — M7989 Other specified soft tissue disorders: Secondary | ICD-10-CM

## 2011-09-07 DIAGNOSIS — I2510 Atherosclerotic heart disease of native coronary artery without angina pectoris: Secondary | ICD-10-CM

## 2011-09-07 DIAGNOSIS — I82409 Acute embolism and thrombosis of unspecified deep veins of unspecified lower extremity: Secondary | ICD-10-CM

## 2011-09-07 DIAGNOSIS — I803 Phlebitis and thrombophlebitis of lower extremities, unspecified: Secondary | ICD-10-CM

## 2011-09-07 LAB — HEPATIC FUNCTION PANEL
ALT: 26 U/L (ref 0–53)
AST: 19 U/L (ref 0–37)
Albumin: 3.8 g/dL (ref 3.5–5.2)
Alkaline Phosphatase: 41 U/L (ref 39–117)
Bilirubin, Direct: 0.1 mg/dL (ref 0.0–0.3)
Total Bilirubin: 0.5 mg/dL (ref 0.3–1.2)
Total Protein: 6.7 g/dL (ref 6.0–8.3)

## 2011-09-07 LAB — LIPID PANEL
Cholesterol: 249 mg/dL — ABNORMAL HIGH (ref 0–200)
HDL: 41.7 mg/dL (ref >39.00)
Total CHOL/HDL Ratio: 6
Triglycerides: 127 mg/dL (ref 0.0–149.0)
VLDL: 25.4 mg/dL (ref 0.0–40.0)

## 2011-09-07 LAB — LDL CHOLESTEROL, DIRECT: Direct LDL: 176.8 mg/dL

## 2011-09-10 ENCOUNTER — Telehealth: Payer: Self-pay | Admitting: Internal Medicine

## 2011-09-10 NOTE — Telephone Encounter (Signed)
Patient states he dropped off a form about a week ago when he had his PT-checked (4.22.13) He would like to know if that has been filled out & if he can pick it up Patient's ph# [phone]

## 2011-09-10 NOTE — Telephone Encounter (Signed)
Dr.Hopper spoke directly with patient. Dr.Hopper is waiting on Reply from Cardiologist

## 2011-09-11 ENCOUNTER — Telehealth: Payer: Self-pay

## 2011-09-11 NOTE — Telephone Encounter (Signed)
Left message with patient's wife to have patient return call when available. Patient was on another call at the time

## 2011-09-11 NOTE — Telephone Encounter (Signed)
Message copied by Maurice Small on Tue Sep 11, 2011  4:52 PM ------      Message from: Pecola Lawless      Created: Tue Sep 11, 2011  3:17 PM       Because of the impending travel to [place] involving multiple flights and residing in a remote area without direct medical care; the Cardiologist and I recommend continuing the Coumadin while you're in [place]. PT/INR should be checked prior to leaving. When you return; warfarin can be discontinued. Pick up form

## 2011-09-12 NOTE — Telephone Encounter (Signed)
Patient returned your call and states he will not call back again. You can call him at [phone] and leave a detailed message if he is not available

## 2011-09-12 NOTE — Telephone Encounter (Signed)
Spoke with patient. Patient ok'd all information. Form placed at the front for pick-up

## 2011-09-17 ENCOUNTER — Other Ambulatory Visit: Payer: Self-pay | Admitting: Internal Medicine

## 2011-09-25 ENCOUNTER — Other Ambulatory Visit: Payer: Self-pay | Admitting: Internal Medicine

## 2011-10-01 ENCOUNTER — Other Ambulatory Visit: Payer: Self-pay | Admitting: *Deleted

## 2011-10-01 DIAGNOSIS — E785 Hyperlipidemia, unspecified: Secondary | ICD-10-CM

## 2011-10-01 MED ORDER — ROSUVASTATIN CALCIUM 10 MG PO TABS: 10.0000 mg | ORAL_TABLET | Freq: Every day | ORAL | Status: DC

## 2011-10-09 ENCOUNTER — Telehealth: Payer: Self-pay | Admitting: Cardiovascular Disease

## 2011-10-09 DIAGNOSIS — E785 Hyperlipidemia, unspecified: Secondary | ICD-10-CM

## 2011-10-09 MED ORDER — ROSUVASTATIN CALCIUM 10 MG PO TABS: 10.0000 mg | ORAL_TABLET | Freq: Every day | ORAL | Status: DC

## 2011-10-09 NOTE — Telephone Encounter (Signed)
New Problem:    Patient called in needing a 90 day refill of his rosuvastatin (CRESTOR) 10 MG tablet.

## 2011-10-12 ENCOUNTER — Telehealth: Payer: Self-pay | Admitting: Cardiovascular Disease

## 2011-10-12 DIAGNOSIS — E785 Hyperlipidemia, unspecified: Secondary | ICD-10-CM

## 2011-10-12 NOTE — Telephone Encounter (Signed)
Clifton James, MD

## 2011-10-12 NOTE — Telephone Encounter (Signed)
Left message on home and work number to call back

## 2011-10-12 NOTE — Telephone Encounter (Signed)
New Problem:    Patient called in because his insurance company needs prior authorization before they will pay for his prescription of rosuvastatin (CRESTOR) 10 MG tablet.  Please call back.

## 2011-10-15 NOTE — Telephone Encounter (Signed)
Chart indicates pt was on pravastatin at one time but I spoke with pt who does not remember taking this.  He does not know of ever being on a statin before.  I told him I would contact insurance to try to get Crestor approved

## 2011-10-15 NOTE — Telephone Encounter (Signed)
Can we start Atorvastatin 40 mg po QHS? Thanks, chris

## 2011-10-15 NOTE — Telephone Encounter (Signed)
I spoke with insurance company and pt must try and fail another statin prior to being approved for Crestor.  Options he can try now are atorvastatin, simvastatin, lovastatin and pravastatin.

## 2011-10-16 MED ORDER — ATORVASTATIN CALCIUM 40 MG PO TABS: 40.0000 mg | ORAL_TABLET | Freq: Every day | ORAL | Status: AC

## 2011-10-16 NOTE — Telephone Encounter (Signed)
Spoke with pt and he will start Atorvastatin and come in for fasting lipid and liver profiles the last week of August. [facility] [address]

## 2011-10-23 ENCOUNTER — Other Ambulatory Visit: Payer: Self-pay | Admitting: Internal Medicine

## 2011-10-24 ENCOUNTER — Other Ambulatory Visit: Payer: Self-pay | Admitting: Internal Medicine

## 2011-10-24 MED ORDER — AMLODIPINE BESYLATE 2.5 MG PO TABS: ORAL_TABLET | ORAL | Status: DC

## 2011-10-24 NOTE — Telephone Encounter (Signed)
Rx sent 

## 2011-10-24 NOTE — Telephone Encounter (Signed)
Zachary Thomas 10/24/2011 3:19 PM Signed  Refill RX denied for Amlodipine Besylate 2.5mg  tab, must be a 90-day supply in order for patients insurance to pay, please review & send new RX if possible  Thanks EMR shows Rx request print 06.11.13 by yourself w/o notation of fax to pharmacy; could you please review. Thanks/SLS

## 2011-10-24 NOTE — Telephone Encounter (Signed)
Refill RX denied for Amlodipine Besylate 2.5mg  tab, must be a 90-day supply in order for patients insurance to pay, please review & send new RX if possible Thanks

## 2011-10-25 ENCOUNTER — Ambulatory Visit: Payer: 59

## 2011-10-26 ENCOUNTER — Ambulatory Visit: Payer: 59

## 2011-11-29 ENCOUNTER — Telehealth: Payer: Self-pay | Admitting: Internal Medicine

## 2011-11-29 NOTE — Telephone Encounter (Signed)
Noted  

## 2011-11-29 NOTE — Telephone Encounter (Signed)
Patient stated his last refill noted NEEDS Office visit, he wanted me to let you know he has scheduled his CPE but it is not until 9.24.13 and he would like to get his refills when he runs out again. No need to call patient back he just wanted Hoop to know

## 2011-12-04 ENCOUNTER — Ambulatory Visit: Payer: 59 | Admitting: Internal Medicine

## 2011-12-05 ENCOUNTER — Ambulatory Visit (INDEPENDENT_AMBULATORY_CARE_PROVIDER_SITE_OTHER): Payer: 59 | Admitting: Sports Medicine

## 2011-12-05 VITALS — BP 134/91 | Ht 75.0 in | Wt 205.0 lb

## 2011-12-05 DIAGNOSIS — M7660 Achilles tendinitis, unspecified leg: Secondary | ICD-10-CM

## 2011-12-05 NOTE — Progress Notes (Signed)
  Subjective:    Patient ID: Zachary Thomas, male    DOB: 1956/04/11, 56 y.o.   MRN: [id number]  HPI Pt that comes for bilateral ankle pain evaluation. He used to be a runner and has chronic achilles tendonitis that has lasted for several years now. His most recent  flare started 6-7 weeks ago with no recall of trauma/fall. Left achilles tendon area is more painful than right. Has been in PT for 1 week and using ice as well with mild to moderated relieve of the pain but not taking him to baseline. Was recommended by PT to consider orthotics. No redness or swelling more than habitual thickening of his achilles tendon. No trauma. He has been prescribed a topical anti-inflammatory by Dr. Farris Has which she is using intermittently. Review of Systems Per HPI    Objective:   Physical Exam Constitutional: NAD, very pleasant pt. MSK: Foot with increased arch. Hammer left 2nd toe. Right and Left Ankle: Thickened achilles tendon, no erythema. Normal  passive and active ROM.Tenderness to palpation bilaterally, more increased on left. Thompson test negative bilaterally. Ankles are stable to ligamentous exam. He is neurovascularly intact distally. Walking with a obvious limp.  Musculoskeletal ultrasound: Both the left and right Achilles tendons were scanned in the long and short views. Left Achilles tendon is markedly thickened and measures 1.28 cm. There is neovascularity around the tendon but none in the tendon itself. This would be consistent with an inflammatory process. The right Achilles tendon measures 0.76 cm.       Assessment & Plan:  1. Bilateral Achilles tendinitis  Patient will continue with his topical anti-inflammatory medicine. I have asked him to use it 3 times daily. He is shown eccentric strengthening exercises and instructed to do them daily. We will fit him with a sports insole and the navicular pad. He states that he does not tolerate heel pads very well therefore I will avoid using heel  lifts. Patient will followup in 3-4 weeks. If symptoms persist, I may consider a trial of topical nitroglycerin patches. Call with questions or concerns in the interim.

## 2011-12-24 ENCOUNTER — Other Ambulatory Visit: Payer: 59

## 2011-12-29 ENCOUNTER — Other Ambulatory Visit: Payer: Self-pay | Admitting: Internal Medicine

## 2012-01-03 ENCOUNTER — Ambulatory Visit (INDEPENDENT_AMBULATORY_CARE_PROVIDER_SITE_OTHER): Payer: 59 | Admitting: Sports Medicine

## 2012-01-03 ENCOUNTER — Encounter: Payer: Self-pay | Admitting: Sports Medicine

## 2012-01-03 VITALS — BP 125/89 | HR 101 | Ht 75.0 in | Wt 205.0 lb

## 2012-01-03 DIAGNOSIS — M6788 Other specified disorders of synovium and tendon, other site: Secondary | ICD-10-CM

## 2012-01-03 DIAGNOSIS — M7660 Achilles tendinitis, unspecified leg: Secondary | ICD-10-CM

## 2012-01-03 MED ORDER — NITROGLYCERIN 0.2 MG/HR TD PT24: MEDICATED_PATCH | TRANSDERMAL | Status: DC

## 2012-01-03 NOTE — Progress Notes (Signed)
  Subjective:    Patient ID: Zachary Thomas, male    DOB: August 21, 1955, 56 y.o.   MRN: [id number]  HPI Pt comes in today for follow up. Still struggling with B/L achilles pain. Topical antiinflammatory has been ineffective. Eccentric exercises are causing him too much pain. Sports insoles, however, seem to be comfortable.    Review of Systems     Objective:   Physical Exam WD, Well nourished. NAD. AAO x 3.  B/L Heels show continued thickening of the achilles tendons, L>R. TTP at L musculoskeletal junction. NV intact distally and walking without a significant limp.       Assessment & Plan:  Chronic B/L achilles tendinopathy, L>R.  Start 1/4 patch NTG over L achilles tendon changing patches QD. Modify eccentric exercises by dropping both heels initially and transitioning to a single leg exercise as pain allows. F/U in 6 weeks for a recheck.

## 2012-01-03 NOTE — Patient Instructions (Addendum)

## 2012-01-07 ENCOUNTER — Other Ambulatory Visit: Payer: Self-pay | Admitting: Internal Medicine

## 2012-01-11 ENCOUNTER — Other Ambulatory Visit (INDEPENDENT_AMBULATORY_CARE_PROVIDER_SITE_OTHER): Payer: 59

## 2012-01-11 DIAGNOSIS — E785 Hyperlipidemia, unspecified: Secondary | ICD-10-CM

## 2012-01-11 LAB — HEPATIC FUNCTION PANEL
ALT: 51 U/L (ref 0–53)
AST: 28 U/L (ref 0–37)
Albumin: 4.1 g/dL (ref 3.5–5.2)
Alkaline Phosphatase: 42 U/L (ref 39–117)
Bilirubin, Direct: 0.1 mg/dL (ref 0.0–0.3)
Total Bilirubin: 0.6 mg/dL (ref 0.3–1.2)
Total Protein: 7.1 g/dL (ref 6.0–8.3)

## 2012-01-11 LAB — LIPID PANEL
Cholesterol: 160 mg/dL (ref 0–200)
HDL: 46.1 mg/dL (ref >39.00)
LDL Cholesterol: 92 mg/dL (ref 0–99)
Total CHOL/HDL Ratio: 3
Triglycerides: 112 mg/dL (ref 0.0–149.0)
VLDL: 22.4 mg/dL (ref 0.0–40.0)

## 2012-02-05 ENCOUNTER — Ambulatory Visit: Payer: 59 | Admitting: Internal Medicine

## 2012-02-12 ENCOUNTER — Other Ambulatory Visit: Payer: Self-pay | Admitting: Internal Medicine

## 2012-02-14 ENCOUNTER — Ambulatory Visit: Payer: 59 | Admitting: Sports Medicine

## 2012-02-22 ENCOUNTER — Encounter: Payer: 59 | Admitting: Internal Medicine

## 2012-04-30 ENCOUNTER — Other Ambulatory Visit: Payer: Self-pay | Admitting: Internal Medicine

## 2012-04-30 MED ORDER — RANITIDINE HCL 150 MG PO TABS: ORAL_TABLET | ORAL | Status: DC

## 2012-04-30 MED ORDER — RAMIPRIL 2.5 MG PO CAPS: ORAL_CAPSULE | ORAL | Status: DC

## 2012-04-30 MED ORDER — AMLODIPINE BESYLATE 2.5 MG PO TABS: ORAL_TABLET | ORAL | Status: DC

## 2012-04-30 NOTE — Telephone Encounter (Signed)
refills x 3 last ov 2012  Ranitidine 150MG  Tablet #180 Take one tablet by mouth twice daily last fill 8.19.13  Amlodipine Besylate 2.5MG  Tab #90 take one tablet by mouth every day  Last fill 10.01.13  Ramipril 2.5MG  capsule #90 Take one capsule by mouth every day

## 2012-05-15 ENCOUNTER — Telehealth: Payer: Self-pay | Admitting: *Deleted

## 2012-05-15 NOTE — Telephone Encounter (Signed)
Received fax from [facility] concerning patients Ranitide150mg . The suggestion is to change the current therapy of 150 mg tablet (#180 dispensed) to a 300 mg tablet (#90 dispensed) and break the 300mg  tablet in half. Spoke with the patient concerning this and he prefers to stay with the current 150 mg tablets. Information faxed back to [facility] noting patients preference.

## 2012-05-23 ENCOUNTER — Encounter: Payer: 59 | Admitting: Internal Medicine

## 2013-06-18 ENCOUNTER — Ambulatory Visit: Payer: 59 | Admitting: Cardiovascular Disease

## 2013-06-26 ENCOUNTER — Encounter (INDEPENDENT_AMBULATORY_CARE_PROVIDER_SITE_OTHER): Payer: Self-pay

## 2013-06-26 ENCOUNTER — Encounter: Payer: Self-pay | Admitting: Cardiovascular Disease

## 2013-06-26 ENCOUNTER — Ambulatory Visit (INDEPENDENT_AMBULATORY_CARE_PROVIDER_SITE_OTHER): Payer: 59 | Admitting: Cardiovascular Disease

## 2013-06-26 VITALS — BP 130/80 | HR 79 | Ht 75.0 in | Wt 215.0 lb

## 2013-06-26 DIAGNOSIS — I2510 Atherosclerotic heart disease of native coronary artery without angina pectoris: Secondary | ICD-10-CM

## 2013-06-26 DIAGNOSIS — E785 Hyperlipidemia, unspecified: Secondary | ICD-10-CM

## 2013-06-26 DIAGNOSIS — I82409 Acute embolism and thrombosis of unspecified deep veins of unspecified lower extremity: Secondary | ICD-10-CM

## 2013-06-26 MED ORDER — METOPROLOL TARTRATE 25 MG PO TABS: 25.0000 mg | ORAL_TABLET | Freq: Two times a day (BID) | ORAL | Status: DC

## 2013-06-26 NOTE — Patient Instructions (Signed)
Your physician wants you to follow-up in: 6 months.   You will receive a reminder letter in the mail two months in advance. If you don't receive a letter, please call our office to schedule the follow-up appointment.  Your physician has requested that you have an echocardiogram. Echocardiography is a painless test that uses sound waves to create images of your heart. It provides your doctor with information about the size and shape of your heart and how well your heart's chambers and valves are working. This procedure takes approximately one hour. There are no restrictions for this procedure.   Your physician has requested that you have an exercise tolerance test. For further information please visit [email or web address]. Please also follow instruction sheet, as given.   Your physician has recommended you make the following change in your medication:  Stop amlodipine.  Start lopressor 25 mg by mouth twice daily
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
## 2013-06-30 ENCOUNTER — Other Ambulatory Visit (HOSPITAL_COMMUNITY): Payer: 59

## 2013-07-03 ENCOUNTER — Ambulatory Visit (HOSPITAL_COMMUNITY): Payer: 59 | Attending: Cardiology | Admitting: Radiology

## 2013-07-03 ENCOUNTER — Other Ambulatory Visit (HOSPITAL_COMMUNITY): Payer: 59

## 2013-07-03 ENCOUNTER — Encounter: Payer: Self-pay | Admitting: Cardiology

## 2013-07-03 DIAGNOSIS — E785 Hyperlipidemia, unspecified: Secondary | ICD-10-CM | POA: Insufficient documentation

## 2013-07-03 DIAGNOSIS — I2510 Atherosclerotic heart disease of native coronary artery without angina pectoris: Secondary | ICD-10-CM | POA: Insufficient documentation

## 2013-07-03 DIAGNOSIS — I10 Essential (primary) hypertension: Secondary | ICD-10-CM | POA: Insufficient documentation

## 2013-07-03 NOTE — Progress Notes (Signed)
Echocardiogram performed.  

## 2013-07-07 ENCOUNTER — Ambulatory Visit (HOSPITAL_COMMUNITY)
Admission: RE | Admit: 2013-07-07 | Discharge: 2013-07-07 | Disposition: A | Discharge status: Home / Self Care | Payer: 59 | Source: Ambulatory Visit | Attending: Cardiology | Admitting: Cardiology

## 2013-07-07 DIAGNOSIS — I2510 Atherosclerotic heart disease of native coronary artery without angina pectoris: Secondary | ICD-10-CM | POA: Insufficient documentation

## 2013-07-10 ENCOUNTER — Telehealth: Payer: Self-pay | Admitting: *Deleted

## 2013-07-10 NOTE — Telephone Encounter (Signed)
Spoke with pt and reviewed stress test results with him.   ----------------------------------------------------------------------------------------------------------------- Joyce CopaJohnson, Mark R     Christopher D McAlhany, MD     Sent: Wed July 08, 2013 11:25 AM      To: Dossie ArbourPatricia L Adelman, RN          Message      Normal stress test. Can we let him know? Thanks, chris

## 2014-08-03 ENCOUNTER — Other Ambulatory Visit: Payer: Self-pay | Admitting: Cardiovascular Disease

## 2014-08-04 ENCOUNTER — Other Ambulatory Visit: Payer: Self-pay

## 2015-09-19 ENCOUNTER — Encounter: Payer: Self-pay | Admitting: Gastroenterology

## 2019-06-11 ENCOUNTER — Ambulatory Visit: Payer: Self-pay

## 2019-06-19 ENCOUNTER — Ambulatory Visit: Payer: Self-pay | Attending: Internal Medicine

## 2019-06-19 DIAGNOSIS — Z23 Encounter for immunization: Secondary | ICD-10-CM | POA: Insufficient documentation

## 2019-06-22 ENCOUNTER — Ambulatory Visit: Payer: Self-pay

## 2019-07-10 ENCOUNTER — Ambulatory Visit: Payer: Self-pay | Attending: Internal Medicine

## 2019-07-10 DIAGNOSIS — Z23 Encounter for immunization: Secondary | ICD-10-CM | POA: Insufficient documentation

## 2019-07-10 NOTE — Progress Notes (Signed)
   Covid-19 Vaccination Clinic  Name:  Zachary Thomas    MRN: [id number] DOB: 1955-11-25  07/10/2019  [name] was observed post Covid-19 immunization for 15 minutes without incidence. He was provided with Vaccine Information Sheet and instruction to access the V-Safe system.   [name] was instructed to call 911 with any severe reactions post vaccine: Marland Kitchen Difficulty breathing  . Swelling of your face and throat  . A fast heartbeat  . A bad rash all over your body  . Dizziness and weakness    Immunizations Administered    Name Date Dose VIS Date Route   Pfizer COVID-19 Vaccine 07/10/2019  3:17 PM 0.3 mL 04/24/2019 Intramuscular   Manufacturer: [employer], [employer]   Lot: [id number]   NDC: [address]-9

## 2019-07-15 ENCOUNTER — Ambulatory Visit: Payer: Self-pay

## 2019-11-18 DIAGNOSIS — E7800 Pure hypercholesterolemia, unspecified: Secondary | ICD-10-CM | POA: Diagnosis not present

## 2019-11-18 DIAGNOSIS — I10 Essential (primary) hypertension: Secondary | ICD-10-CM | POA: Diagnosis not present

## 2019-11-18 DIAGNOSIS — Z125 Encounter for screening for malignant neoplasm of prostate: Secondary | ICD-10-CM | POA: Diagnosis not present

## 2019-11-23 DIAGNOSIS — I2510 Atherosclerotic heart disease of native coronary artery without angina pectoris: Secondary | ICD-10-CM | POA: Diagnosis not present

## 2019-11-23 DIAGNOSIS — E7800 Pure hypercholesterolemia, unspecified: Secondary | ICD-10-CM | POA: Diagnosis not present

## 2019-11-23 DIAGNOSIS — K219 Gastro-esophageal reflux disease without esophagitis: Secondary | ICD-10-CM | POA: Diagnosis not present

## 2019-11-23 DIAGNOSIS — Z7982 Long term (current) use of aspirin: Secondary | ICD-10-CM | POA: Diagnosis not present

## 2019-11-23 DIAGNOSIS — I10 Essential (primary) hypertension: Secondary | ICD-10-CM | POA: Diagnosis not present

## 2019-11-23 DIAGNOSIS — B353 Tinea pedis: Secondary | ICD-10-CM | POA: Diagnosis not present

## 2019-11-23 DIAGNOSIS — Z0001 Encounter for general adult medical examination with abnormal findings: Secondary | ICD-10-CM | POA: Diagnosis not present

## 2019-11-23 DIAGNOSIS — I252 Old myocardial infarction: Secondary | ICD-10-CM | POA: Diagnosis not present

## 2019-11-23 DIAGNOSIS — Z955 Presence of coronary angioplasty implant and graft: Secondary | ICD-10-CM | POA: Diagnosis not present

## 2019-11-24 DIAGNOSIS — H2513 Age-related nuclear cataract, bilateral: Secondary | ICD-10-CM | POA: Diagnosis not present

## 2019-11-24 DIAGNOSIS — H5213 Myopia, bilateral: Secondary | ICD-10-CM | POA: Diagnosis not present

## 2019-12-22 ENCOUNTER — Encounter: Payer: Self-pay | Admitting: Sports Medicine

## 2019-12-22 ENCOUNTER — Ambulatory Visit (INDEPENDENT_AMBULATORY_CARE_PROVIDER_SITE_OTHER): Payer: Medicare PPO

## 2019-12-22 ENCOUNTER — Other Ambulatory Visit: Payer: Self-pay

## 2019-12-22 ENCOUNTER — Ambulatory Visit (INDEPENDENT_AMBULATORY_CARE_PROVIDER_SITE_OTHER): Payer: Medicare PPO | Admitting: Sports Medicine

## 2019-12-22 DIAGNOSIS — M9261 Juvenile osteochondrosis of tarsus, right ankle: Secondary | ICD-10-CM | POA: Diagnosis not present

## 2019-12-22 DIAGNOSIS — M19071 Primary osteoarthritis, right ankle and foot: Secondary | ICD-10-CM | POA: Diagnosis not present

## 2019-12-22 MED ORDER — NITROGLYCERIN 0.2 MG/HR TD PT24: MEDICATED_PATCH | TRANSDERMAL | 11 refills | Status: DC

## 2019-12-22 MED ORDER — MELOXICAM 15 MG PO TABS: ORAL_TABLET | ORAL | 3 refills | Status: DC

## 2019-12-22 NOTE — Progress Notes (Signed)
    Procedures performed today:    None.  Independent interpretation of notes and tests performed by another provider:   None.  Brief History, Exam, Impression, and Recommendations:    Zachary Thomas is a pleasnat 64yo male who has had pain in his right heel for decades. It hurts most around the insertion of the achilles. He has tried multiple interventions inclusing PT, short term nitroglycerin pathces, and anti-inflammatories with minimal relief. He has a large haglunds deformity on both his right and left heel and very high arches. This is most likely insertional achilles tendinitis. We are going to get an Xray today. We are going to prescribe meloxicam and nitroglycerin patches for him to use. He is going to get custom orthotics made. We will see him in 1-2 months to reevaluate and determine if further intervention including injection would be needed.   Zachary Thomas, MS3   ___________________________________________ Ihor Austin. Benjamin Stain, M.D., ABFM., CAQSM. Primary Care and Sports Medicine [place] [facility]  Adjunct Instructor of Family Medicine  [employer]

## 2019-12-22 NOTE — Assessment & Plan Note (Signed)
This is a very pleasant 64 year old male, for decades he is in pain in his right heel, and lesser so in the left heel, right at the Achilles insertion, worse with the first few steps in the morning, he has tried multiple modalities for treatment including topical modalities, physical therapy, topical nitroglycerin that was only tried for a matter of weeks. He also has minimal tenderness at the Achilles insertion, and severe pes cavus. I do think we should get him a custom molded orthotic with a heel lift, referral to Dr. Jordan Likes for this, topical nitroglycerin, x-rays, meloxicam. Return to see me in 6 weeks, we will further discuss an injection if no better versus going up to a higher dose of topical nitroglycerin.

## 2019-12-23 ENCOUNTER — Telehealth: Payer: Self-pay | Admitting: Family Medicine

## 2019-12-23 NOTE — Telephone Encounter (Signed)
Dr. Benjamin Stain referred pt for orthotics w/ Dr. Rayburn Felt pt to set appt --left msg for pt to call office.  --glh

## 2019-12-28 ENCOUNTER — Other Ambulatory Visit: Payer: Self-pay

## 2019-12-28 ENCOUNTER — Encounter: Payer: Self-pay | Admitting: Family Medicine

## 2019-12-28 ENCOUNTER — Ambulatory Visit: Payer: Medicare PPO | Admitting: Family Medicine

## 2019-12-28 DIAGNOSIS — M9261 Juvenile osteochondrosis of tarsus, right ankle: Secondary | ICD-10-CM | POA: Diagnosis not present

## 2019-12-28 NOTE — Assessment & Plan Note (Signed)
Acute on chronic in nature.  Has changes observed on imaging and has been dealing with this for several years. -Counseled on supportive care. -Orthotics.  May need to consider a scaphoid pad as well as metatarsal pads and heel lift.
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
## 2020-02-02 ENCOUNTER — Ambulatory Visit (INDEPENDENT_AMBULATORY_CARE_PROVIDER_SITE_OTHER): Payer: Medicare PPO | Admitting: Sports Medicine

## 2020-02-02 ENCOUNTER — Other Ambulatory Visit: Payer: Self-pay

## 2020-02-02 DIAGNOSIS — M9261 Juvenile osteochondrosis of tarsus, right ankle: Secondary | ICD-10-CM

## 2020-02-02 NOTE — Assessment & Plan Note (Signed)
Zachary Thomas returns, he continues to have some pain in his right heel, left heel is doing okay. He did not do the topical nitroglycerin, NSAIDs are helping, he is also going to increase his diligence with the rehab exercises and return to see me in a month.

## 2020-02-02 NOTE — Progress Notes (Signed)
    Procedures performed today:    None.  Independent interpretation of notes and tests performed by another provider:   None.  Brief History, Exam, Impression, and Recommendations:    Haglund's deformity of right heel Zachary Thomas returns, he continues to have some pain in his right heel, left heel is doing okay. He did not do the topical nitroglycerin, NSAIDs are helping, he is also going to increase his diligence with the rehab exercises and return to see me in a month.     ___________________________________________ Ihor Austin. Benjamin Stain, M.D., ABFM., CAQSM. Primary Care and Sports Medicine  [facility]  Adjunct Instructor of Family Medicine  [employer]

## 2020-02-02 NOTE — Patient Instructions (Signed)
Begin with easy walking, heel, toe and backwards  Do calf raises on a step:  First lower and then raise on 1 foot  If this is painful, lower on 1 foot, do the heel raise on both feet Begin with 3 sets of 10 repetitions  Increase by 5 repetitions every 3 days  Goal is 3 sets of 30 repetitions  Do with both straight knee and knee at 20 degrees of flexion  If pain persists, once you can do 3 sets of 30 without weight, add backpack with 5 lbs.  Increase by 5 lbs per week to max of 30 lbs for 3 sets of 15   

## 2020-02-17 MED ORDER — NITROGLYCERIN 0.2 MG/HR TD PT24: MEDICATED_PATCH | TRANSDERMAL | 11 refills | Status: DC

## 2020-02-17 NOTE — Addendum Note (Signed)
Addended by: Monica Becton on: 02/17/2020 04:57 PM   Modules accepted: Orders

## 2020-02-24 DIAGNOSIS — L989 Disorder of the skin and subcutaneous tissue, unspecified: Secondary | ICD-10-CM | POA: Diagnosis not present

## 2020-02-24 DIAGNOSIS — Z23 Encounter for immunization: Secondary | ICD-10-CM | POA: Diagnosis not present

## 2020-03-01 ENCOUNTER — Ambulatory Visit (INDEPENDENT_AMBULATORY_CARE_PROVIDER_SITE_OTHER): Payer: Medicare PPO | Admitting: Sports Medicine

## 2020-03-01 DIAGNOSIS — M9261 Juvenile osteochondrosis of tarsus, right ankle: Secondary | ICD-10-CM | POA: Diagnosis not present

## 2020-03-01 NOTE — Progress Notes (Signed)
    Procedures performed today:    None.  Independent interpretation of notes and tests performed by another provider:   None.  Brief History, Exam, Impression, and Recommendations:    Haglund's deformity of right heel Zachary Thomas returns, he is a pleasant 64 year old male with predominantly right-sided insertional Achilles tendinosis/Haglund's deformity. At this point he really does not know if he is much better than when we started treating him, we have been doing topical nitroglycerin, orthotics, NSAIDs. Heel lift. At this point we will probably do an injection but he would like to postpone this to another date, [place] going to [place] to [place], and understands that we will need to place him in a boot after the injection, he will schedule this at a future date, this will be a retrocalcaneal injection.    ___________________________________________ Ihor Austin. Benjamin Stain, M.D., ABFM., CAQSM. Primary Care and Sports Medicine [place] [facility]  Adjunct Instructor of Family Medicine  [employer]

## 2020-03-01 NOTE — Assessment & Plan Note (Signed)
Zachary Thomas returns, he is a pleasant 64 year old male with predominantly right-sided insertional Achilles tendinosis/Haglund's deformity. At this point he really does not know if he is much better than when we started treating him, we have been doing topical nitroglycerin, orthotics, NSAIDs. Heel lift. At this point we will probably do an injection but he would like to postpone this to another date, [place] is going to [place] to [place], and understands that we will need to place him in a boot after the injection, he will schedule this at a future date, this will be a retrocalcaneal injection.

## 2020-08-07 ENCOUNTER — Other Ambulatory Visit: Payer: Self-pay | Admitting: Sports Medicine

## 2020-08-07 DIAGNOSIS — M9261 Juvenile osteochondrosis of tarsus, right ankle: Secondary | ICD-10-CM

## 2020-10-03 DIAGNOSIS — W57XXXA Bitten or stung by nonvenomous insect and other nonvenomous arthropods, initial encounter: Secondary | ICD-10-CM | POA: Diagnosis not present

## 2020-10-03 DIAGNOSIS — S2096XA Insect bite (nonvenomous) of unspecified parts of thorax, initial encounter: Secondary | ICD-10-CM | POA: Diagnosis not present

## 2021-01-11 DIAGNOSIS — H2513 Age-related nuclear cataract, bilateral: Secondary | ICD-10-CM | POA: Diagnosis not present

## 2021-01-11 DIAGNOSIS — H5213 Myopia, bilateral: Secondary | ICD-10-CM | POA: Diagnosis not present

## 2021-03-03 DIAGNOSIS — E7801 Familial hypercholesterolemia: Secondary | ICD-10-CM | POA: Diagnosis not present

## 2021-03-03 DIAGNOSIS — Z125 Encounter for screening for malignant neoplasm of prostate: Secondary | ICD-10-CM | POA: Diagnosis not present

## 2021-03-03 DIAGNOSIS — I10 Essential (primary) hypertension: Secondary | ICD-10-CM | POA: Diagnosis not present

## 2021-03-09 DIAGNOSIS — K219 Gastro-esophageal reflux disease without esophagitis: Secondary | ICD-10-CM | POA: Diagnosis not present

## 2021-03-09 DIAGNOSIS — Z0000 Encounter for general adult medical examination without abnormal findings: Secondary | ICD-10-CM | POA: Diagnosis not present

## 2021-03-09 DIAGNOSIS — I10 Essential (primary) hypertension: Secondary | ICD-10-CM | POA: Diagnosis not present

## 2021-03-09 DIAGNOSIS — I499 Cardiac arrhythmia, unspecified: Secondary | ICD-10-CM | POA: Diagnosis not present

## 2021-03-09 DIAGNOSIS — Z955 Presence of coronary angioplasty implant and graft: Secondary | ICD-10-CM | POA: Diagnosis not present

## 2021-03-09 DIAGNOSIS — J3089 Other allergic rhinitis: Secondary | ICD-10-CM | POA: Diagnosis not present

## 2021-03-09 DIAGNOSIS — L84 Corns and callosities: Secondary | ICD-10-CM | POA: Diagnosis not present

## 2021-03-09 DIAGNOSIS — M79671 Pain in right foot: Secondary | ICD-10-CM | POA: Diagnosis not present

## 2021-03-19 DIAGNOSIS — Z1212 Encounter for screening for malignant neoplasm of rectum: Secondary | ICD-10-CM | POA: Diagnosis not present

## 2021-03-19 DIAGNOSIS — Z1211 Encounter for screening for malignant neoplasm of colon: Secondary | ICD-10-CM | POA: Diagnosis not present

## 2021-03-21 DIAGNOSIS — R009 Unspecified abnormalities of heart beat: Secondary | ICD-10-CM | POA: Diagnosis not present

## 2021-03-22 IMAGING — DX DG OS CALCIS 2+V*R*
2 series · 2 of 2 positions shown · non-contrast
Comparison: None.

CLINICAL DATA: Chronic heel pain

EXAM:
RIGHT OS CALCIS - 2+ VIEW

[calcaneus axial]
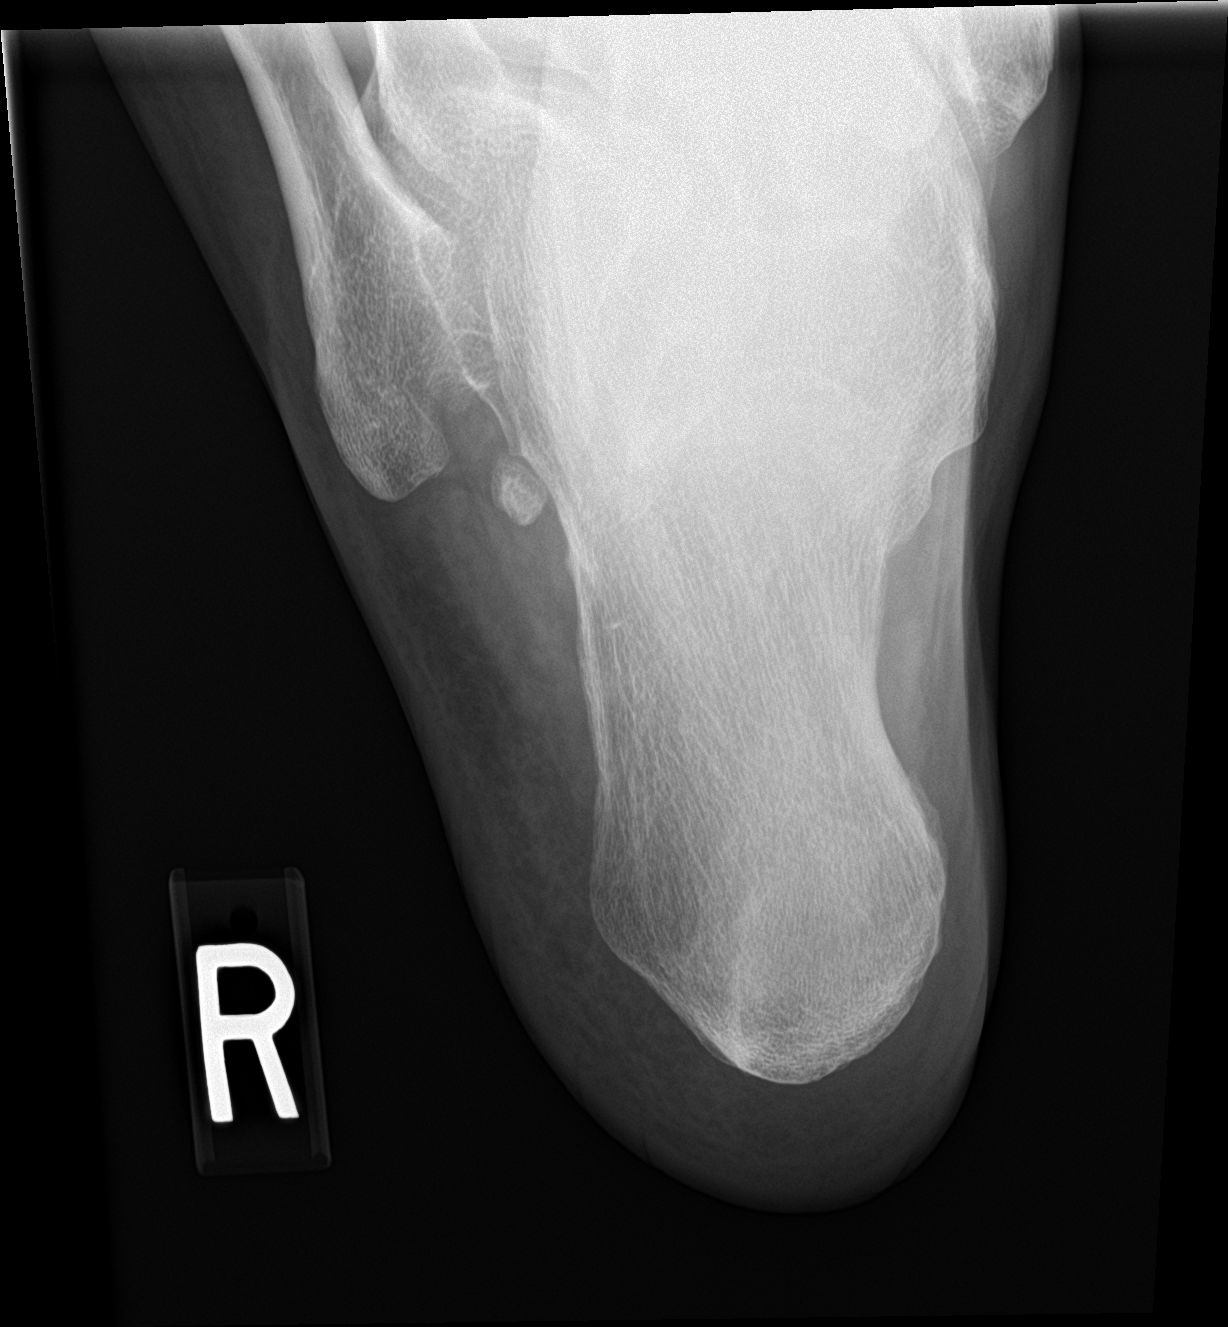

[calcaneus lat]
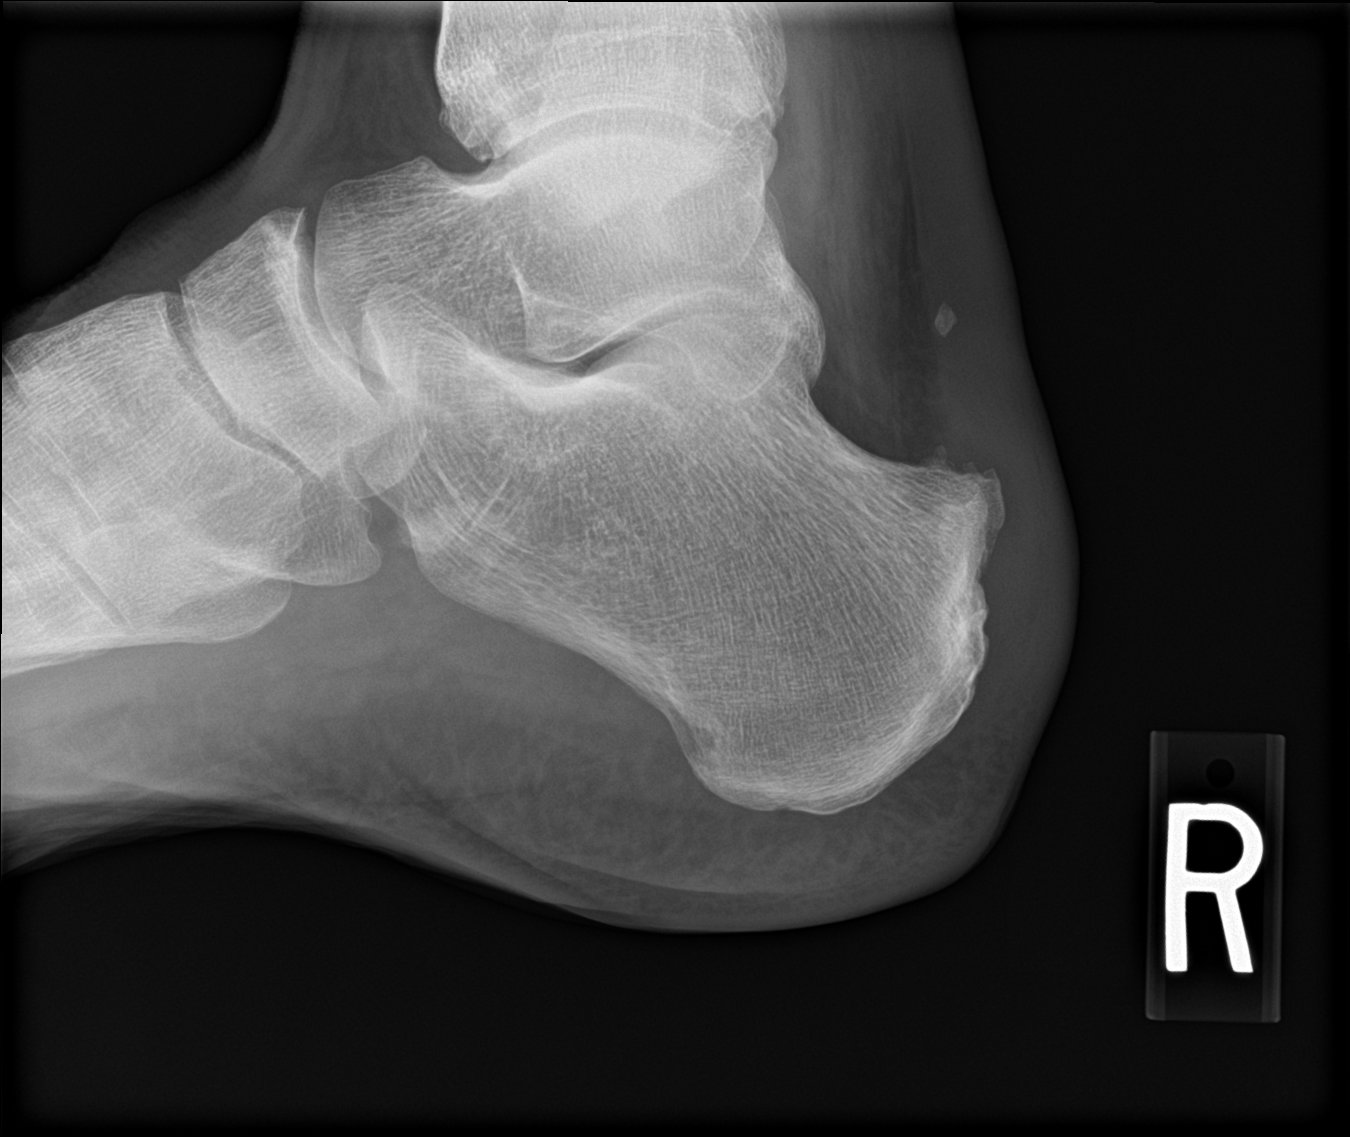

[2 of 2 positions shown; findings below may reference images not displayed]

FINDINGS: Lateral and Axel views obtained. No fracture or dislocation. There
is mild narrowing of the posterior subtalar joint. Other joint
spaces appear normal. There is cortical irregularity along the
posterior aspect of the calcaneus in the region of the expected
Achilles tendon insertion site. A small focus of calcification is
noted posterior to the posterior subtalar joint, likely representing
localized calcific tendinosis. No erosive change.
IMPRESSION: Cortical irregularity along the distal calcaneus near the Achilles
tendon insertion site. This finding may be indicative of a degree of
Greisita type deformity. Calcific tendinosis in this area also noted.

There is narrowing of the posterior subtalar joint. Other joint
spaces appear normal. No fracture or dislocation.

## 2021-03-25 LAB — COLOGUARD: COLOGUARD: POSITIVE — AB

## 2021-03-31 DIAGNOSIS — Z1211 Encounter for screening for malignant neoplasm of colon: Secondary | ICD-10-CM | POA: Diagnosis not present

## 2021-03-31 DIAGNOSIS — R195 Other fecal abnormalities: Secondary | ICD-10-CM | POA: Diagnosis not present

## 2021-04-04 DIAGNOSIS — I499 Cardiac arrhythmia, unspecified: Secondary | ICD-10-CM | POA: Diagnosis not present

## 2021-04-13 ENCOUNTER — Ambulatory Visit: Payer: Medicare PPO | Admitting: Sports Medicine

## 2021-04-20 ENCOUNTER — Encounter: Payer: Self-pay | Admitting: Internal Medicine
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
## 2021-04-21 ENCOUNTER — Encounter: Payer: Self-pay | Admitting: Internal Medicine

## 2021-04-21 ENCOUNTER — Ambulatory Visit: Payer: Medicare PPO | Admitting: Internal Medicine

## 2021-04-21 ENCOUNTER — Other Ambulatory Visit: Payer: Self-pay

## 2021-04-21 VITALS — BP 110/64 | HR 83 | Ht 75.0 in | Wt 181.0 lb

## 2021-04-21 DIAGNOSIS — R002 Palpitations: Secondary | ICD-10-CM | POA: Diagnosis not present

## 2021-04-21 DIAGNOSIS — I719 Aortic aneurysm of unspecified site, without rupture: Secondary | ICD-10-CM | POA: Diagnosis not present

## 2021-04-21 MED ORDER — METOPROLOL TARTRATE 25 MG PO TABS: 25.0000 mg | ORAL_TABLET | Freq: Two times a day (BID) | ORAL | 6 refills | Status: AC

## 2021-04-21 NOTE — Patient Instructions (Signed)
Medication Instructions:  PLEASE TAKE METOPROLOL TARTRATE 25mg  TWICE DAILY  *If you need a refill on your cardiac medications before your next appointment, please call your pharmacy*  Testing/Procedures: Your physician has requested that you have an echocardiogram. Echocardiography is a painless test that uses sound waves to create images of your heart. It provides your doctor with information about the size and shape of your heart and how well your heart's chambers and valves are working. You may receive an ultrasound enhancing agent through an IV if needed to better visualize your heart during the echo.This procedure takes approximately one hour. There are no restrictions for this procedure. This will take place at the [address].   Follow-Up: At [facility], you and your health needs are our priority.  As part of our continuing mission to provide you with exceptional heart care, we have created designated Provider Care Teams.  These Care Teams include your primary Cardiologist (physician) and Advanced Practice Providers (APPs -  Physician Assistants and Nurse Practitioners) who all work together to provide you with the care you need, when you need it.  Your next appointment:   6 month(s)  The format for your next appointment:   In Person  Provider:   [facility], MD    Other Instructions PLEASE LET Maisie Fus KNOW IF SYMPTOMS WORSE- NUMBER TO THE OFFICE IS [phone]

## 2021-04-26 DIAGNOSIS — K635 Polyp of colon: Secondary | ICD-10-CM | POA: Diagnosis not present

## 2021-04-26 DIAGNOSIS — Z1211 Encounter for screening for malignant neoplasm of colon: Secondary | ICD-10-CM | POA: Diagnosis not present

## 2021-05-02 DIAGNOSIS — K635 Polyp of colon: Secondary | ICD-10-CM | POA: Diagnosis not present

## 2021-05-16 ENCOUNTER — Ambulatory Visit (HOSPITAL_COMMUNITY): Payer: Medicare PPO

## 2021-05-26 ENCOUNTER — Other Ambulatory Visit: Payer: Self-pay

## 2021-05-26 ENCOUNTER — Ambulatory Visit (HOSPITAL_COMMUNITY): Payer: Medicare PPO | Attending: Internal Medicine

## 2021-05-26 DIAGNOSIS — I7121 Aneurysm of the ascending aorta, without rupture: Secondary | ICD-10-CM

## 2021-05-26 DIAGNOSIS — I2510 Atherosclerotic heart disease of native coronary artery without angina pectoris: Secondary | ICD-10-CM | POA: Diagnosis not present

## 2021-05-26 DIAGNOSIS — I10 Essential (primary) hypertension: Secondary | ICD-10-CM | POA: Diagnosis not present

## 2021-05-26 DIAGNOSIS — R002 Palpitations: Secondary | ICD-10-CM | POA: Diagnosis not present

## 2021-05-26 DIAGNOSIS — I719 Aortic aneurysm of unspecified site, without rupture: Secondary | ICD-10-CM

## 2021-05-26 DIAGNOSIS — I340 Nonrheumatic mitral (valve) insufficiency: Secondary | ICD-10-CM | POA: Diagnosis not present

## 2021-05-26 LAB — ECHOCARDIOGRAM COMPLETE
Area-P 1/2: 7.99 cm2
S' Lateral: 1.9 cm

## 2021-06-29 DIAGNOSIS — H40022 Open angle with borderline findings, high risk, left eye: Secondary | ICD-10-CM | POA: Diagnosis not present

## 2021-06-29 DIAGNOSIS — H35362 Drusen (degenerative) of macula, left eye: Secondary | ICD-10-CM | POA: Diagnosis not present

## 2021-06-29 DIAGNOSIS — H2513 Age-related nuclear cataract, bilateral: Secondary | ICD-10-CM | POA: Diagnosis not present

## 2021-07-14 DIAGNOSIS — H2513 Age-related nuclear cataract, bilateral: Secondary | ICD-10-CM | POA: Diagnosis not present

## 2021-10-24 ENCOUNTER — Ambulatory Visit: Payer: Medicare PPO | Admitting: Internal Medicine

## 2022-01-12 DIAGNOSIS — H5213 Myopia, bilateral: Secondary | ICD-10-CM | POA: Diagnosis not present

## 2022-01-12 DIAGNOSIS — H2513 Age-related nuclear cataract, bilateral: Secondary | ICD-10-CM | POA: Diagnosis not present

## 2022-02-21 DIAGNOSIS — H2512 Age-related nuclear cataract, left eye: Secondary | ICD-10-CM | POA: Diagnosis not present

## 2022-02-21 DIAGNOSIS — H269 Unspecified cataract: Secondary | ICD-10-CM | POA: Diagnosis not present

## 2022-03-08 DIAGNOSIS — Z125 Encounter for screening for malignant neoplasm of prostate: Secondary | ICD-10-CM | POA: Diagnosis not present

## 2022-03-08 DIAGNOSIS — I10 Essential (primary) hypertension: Secondary | ICD-10-CM | POA: Diagnosis not present

## 2022-03-13 DIAGNOSIS — E871 Hypo-osmolality and hyponatremia: Secondary | ICD-10-CM | POA: Diagnosis not present

## 2022-03-13 DIAGNOSIS — I10 Essential (primary) hypertension: Secondary | ICD-10-CM | POA: Diagnosis not present

## 2022-03-13 DIAGNOSIS — Z955 Presence of coronary angioplasty implant and graft: Secondary | ICD-10-CM | POA: Diagnosis not present

## 2022-03-13 DIAGNOSIS — D72819 Decreased white blood cell count, unspecified: Secondary | ICD-10-CM | POA: Diagnosis not present

## 2022-03-13 DIAGNOSIS — I2510 Atherosclerotic heart disease of native coronary artery without angina pectoris: Secondary | ICD-10-CM | POA: Diagnosis not present

## 2022-03-13 DIAGNOSIS — Z0000 Encounter for general adult medical examination without abnormal findings: Secondary | ICD-10-CM | POA: Diagnosis not present

## 2022-03-13 DIAGNOSIS — J3089 Other allergic rhinitis: Secondary | ICD-10-CM | POA: Diagnosis not present

## 2022-03-14 DIAGNOSIS — H2511 Age-related nuclear cataract, right eye: Secondary | ICD-10-CM | POA: Diagnosis not present

## 2022-03-14 DIAGNOSIS — H269 Unspecified cataract: Secondary | ICD-10-CM | POA: Diagnosis not present

## 2022-03-28 DIAGNOSIS — Z23 Encounter for immunization: Secondary | ICD-10-CM | POA: Diagnosis not present

## 2022-05-24 DIAGNOSIS — J069 Acute upper respiratory infection, unspecified: Secondary | ICD-10-CM | POA: Diagnosis not present

## 2022-08-27 ENCOUNTER — Encounter: Payer: Self-pay | Admitting: *Deleted

## 2022-11-27 DIAGNOSIS — H40023 Open angle with borderline findings, high risk, bilateral: Secondary | ICD-10-CM | POA: Diagnosis not present

## 2022-11-27 DIAGNOSIS — Z961 Presence of intraocular lens: Secondary | ICD-10-CM | POA: Diagnosis not present

## 2023-03-14 DIAGNOSIS — Z125 Encounter for screening for malignant neoplasm of prostate: Secondary | ICD-10-CM | POA: Diagnosis not present

## 2023-03-14 DIAGNOSIS — I10 Essential (primary) hypertension: Secondary | ICD-10-CM | POA: Diagnosis not present

## 2023-03-19 DIAGNOSIS — I10 Essential (primary) hypertension: Secondary | ICD-10-CM | POA: Diagnosis not present

## 2023-03-19 DIAGNOSIS — Z0000 Encounter for general adult medical examination without abnormal findings: Secondary | ICD-10-CM | POA: Diagnosis not present

## 2023-03-19 DIAGNOSIS — K219 Gastro-esophageal reflux disease without esophagitis: Secondary | ICD-10-CM | POA: Diagnosis not present

## 2023-03-19 DIAGNOSIS — J3089 Other allergic rhinitis: Secondary | ICD-10-CM | POA: Diagnosis not present

## 2023-03-19 DIAGNOSIS — Z86718 Personal history of other venous thrombosis and embolism: Secondary | ICD-10-CM | POA: Diagnosis not present

## 2023-03-19 DIAGNOSIS — I2510 Atherosclerotic heart disease of native coronary artery without angina pectoris: Secondary | ICD-10-CM | POA: Diagnosis not present

## 2023-03-19 DIAGNOSIS — Z955 Presence of coronary angioplasty implant and graft: Secondary | ICD-10-CM | POA: Diagnosis not present

## 2023-03-20 ENCOUNTER — Telehealth: Payer: Self-pay | Admitting: Internal Medicine

## 2023-03-20 NOTE — Telephone Encounter (Signed)
Received a referral from Dr. Merri Brunette stating the patient would like to switch from Dr. Wyline Mood to Dr. Tenny Craw.  Are both of you in agreement with this?

## 2023-03-22 NOTE — Telephone Encounter (Signed)
Left a message for the pt to call back to make him an appt.. probably for Jan 2025.

## 2023-03-25 ENCOUNTER — Ambulatory Visit: Payer: Medicare PPO | Attending: Internal Medicine | Admitting: Internal Medicine

## 2023-03-25 ENCOUNTER — Encounter: Payer: Self-pay | Admitting: Internal Medicine

## 2023-03-25 VITALS — BP 100/80 | HR 71 | Ht 75.0 in | Wt 185.4 lb

## 2023-03-25 DIAGNOSIS — Z79899 Other long term (current) drug therapy: Secondary | ICD-10-CM

## 2023-03-25 DIAGNOSIS — I10 Essential (primary) hypertension: Secondary | ICD-10-CM | POA: Diagnosis not present

## 2023-03-25 DIAGNOSIS — I2510 Atherosclerotic heart disease of native coronary artery without angina pectoris: Secondary | ICD-10-CM | POA: Diagnosis not present
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
## 2023-03-25 NOTE — Addendum Note (Signed)
Addended by: Bertram Millard on: 03/25/2023 01:24 PM   Modules accepted: Orders

## 2023-03-25 NOTE — Patient Instructions (Signed)
Medication Instructions:   *If you need a refill on your cardiac medications before your next appointment, please call your pharmacy*   Lab Work:  If you have labs (blood work) drawn today and your tests are completely normal, you will receive your results only by: MyChart Message (if you have MyChart) OR A paper copy in the mail If you have any lab test that is abnormal or we need to change your treatment, we will call you to review the results.   Testing/Procedures:    Follow-Up: At [facility], you and your health needs are our priority.  As part of our continuing mission to provide you with exceptional heart care, we have created designated Provider Care Teams.  These Care Teams include your primary Cardiologist (physician) and Advanced Practice Providers (APPs -  Physician Assistants and Nurse Practitioners) who all work together to provide you with the care you need, when you need it.  We recommend signing up for the patient portal called "MyChart".  Sign up information is provided on this After Visit Summary.  MyChart is used to connect with patients for Virtual Visits (Telemedicine).  Patients are able to view lab/test results, encounter notes, upcoming appointments, etc.  Non-urgent messages can be sent to your provider as well.   To learn more about what you can do with MyChart, go to [email or web address].    Your next appointment:   Dr Dietrich Pates If Card or EP not listed click to update   DO NOT delete brackets or number around this link :1}   Other Instructions

## 2023-03-25 NOTE — Telephone Encounter (Signed)
Pt has OV today 03/25/23.

## 2023-03-28 ENCOUNTER — Encounter: Payer: Self-pay | Admitting: Internal Medicine

## 2023-04-04 DIAGNOSIS — Z955 Presence of coronary angioplasty implant and graft: Secondary | ICD-10-CM | POA: Diagnosis not present

## 2023-04-04 DIAGNOSIS — E7800 Pure hypercholesterolemia, unspecified: Secondary | ICD-10-CM | POA: Diagnosis not present

## 2023-04-04 DIAGNOSIS — I2510 Atherosclerotic heart disease of native coronary artery without angina pectoris: Secondary | ICD-10-CM | POA: Diagnosis not present

## 2023-05-20 DIAGNOSIS — I2510 Atherosclerotic heart disease of native coronary artery without angina pectoris: Secondary | ICD-10-CM | POA: Diagnosis not present

## 2023-09-23 ENCOUNTER — Ambulatory Visit

## 2023-09-23 ENCOUNTER — Ambulatory Visit (INDEPENDENT_AMBULATORY_CARE_PROVIDER_SITE_OTHER): Payer: Self-pay | Admitting: Sports Medicine

## 2023-09-23 DIAGNOSIS — M25572 Pain in left ankle and joints of left foot: Secondary | ICD-10-CM | POA: Diagnosis not present

## 2023-09-23 DIAGNOSIS — S93402A Sprain of unspecified ligament of left ankle, initial encounter: Secondary | ICD-10-CM | POA: Diagnosis not present

## 2023-09-23 NOTE — Progress Notes (Signed)
    Procedures performed today:    None.  Independent interpretation of notes and tests performed by another provider:   None.  Brief History, Exam, Impression, and Recommendations:    Pain in left ankle This is a very pleasant 67 year old male, he was [place] hiking, inverted his ankle about a week ago, he had immediate pain, swelling, bruising, he was able to finish his hike, he had 4 more miles to go. He is here today, his ankle is swollen, he is tenderness directly over the fibula itself, not so much over the ATFL, CFL. He has good motion, good strength, ankle is stable though difficult to examine as swollen as it is. We will do a cam boot, x-rays, he can do over-the-counter relief, home PT given, return to see me in 3 weeks.    ____________________________________________ Joselyn Nicely. Sandy Crumb, M.D., ABFM., CAQSM., AME. Primary Care and Sports Medicine [facility]  Adjunct Professor of [facility] Medicine  [employer]  Restaurant manager, fast food

## 2023-09-23 NOTE — Assessment & Plan Note (Signed)
 This is a very pleasant 68 year old male, he was [place], inverted his ankle about a week ago, he had immediate pain, swelling, bruising, he was able to finish his hike, he had 4 more miles to go. He is here today, his ankle is swollen, he is tenderness directly over the fibula itself, not so much over the ATFL, CFL. He has good motion, good strength, ankle is stable though difficult to examine as swollen as it is. We will do a cam boot, x-rays, he can do over-the-counter relief, home PT given, return to see me in 3 weeks.

## 2023-09-24 ENCOUNTER — Encounter: Payer: Self-pay | Admitting: Internal Medicine

## 2023-09-24 ENCOUNTER — Ambulatory Visit: Payer: Self-pay | Admitting: Sports Medicine

## 2023-09-27 ENCOUNTER — Encounter: Payer: Self-pay | Admitting: Internal Medicine

## 2023-09-30 NOTE — Telephone Encounter (Signed)
 IT is not uncommon to have difference in arm BP readings   Not concerning  Go with the arm with the highest BP Let us  know if consistently above 135   /

## 2023-10-14 ENCOUNTER — Ambulatory Visit: Admitting: Sports Medicine

## 2023-10-15 ENCOUNTER — Ambulatory Visit: Admitting: Sports Medicine

## 2023-10-15 DIAGNOSIS — M25572 Pain in left ankle and joints of left foot: Secondary | ICD-10-CM | POA: Diagnosis not present

## 2023-10-15 NOTE — Assessment & Plan Note (Addendum)
 Lateral ankle sprain about 4 weeks ago, essentially resolved, able to hike and play pickle ball now, he will wear his ankle stabilizing orthosis for at least 6 months when he goes out on hikes, he will continue the home PT, and return to see me as needed.

## 2023-10-15 NOTE — Progress Notes (Signed)
    Procedures performed today:    None.  Independent interpretation of notes and tests performed by another provider:   None.  Brief History, Exam, Impression, and Recommendations:    Pain in left ankle Lateral ankle sprain about 4 weeks ago, essentially resolved, able to hike and play pickle ball now, he will wear his ankle stabilizing orthosis for at least 6 months when he goes out on hikes, he will continue the home PT, and return to see me as needed.    ____________________________________________ Joselyn Nicely. Sandy Crumb, M.D., ABFM., CAQSM., AME. Primary Care and Sports Medicine [facility]  Adjunct Professor of [facility] Medicine  [employer]  Restaurant manager, fast food

## 2023-10-17 DIAGNOSIS — W57XXXA Bitten or stung by nonvenomous insect and other nonvenomous arthropods, initial encounter: Secondary | ICD-10-CM | POA: Diagnosis not present

## 2023-10-17 DIAGNOSIS — S80262A Insect bite (nonvenomous), left knee, initial encounter: Secondary | ICD-10-CM | POA: Diagnosis not present

## 2023-10-17 DIAGNOSIS — L03116 Cellulitis of left lower limb: Secondary | ICD-10-CM | POA: Diagnosis not present

## 2023-12-31 DIAGNOSIS — H40013 Open angle with borderline findings, low risk, bilateral: Secondary | ICD-10-CM | POA: Diagnosis not present

## 2024-01-14 ENCOUNTER — Encounter: Payer: Self-pay | Admitting: Sports Medicine

## 2024-02-04 DIAGNOSIS — H401122 Primary open-angle glaucoma, left eye, moderate stage: Secondary | ICD-10-CM | POA: Diagnosis not present

## 2024-03-18 DIAGNOSIS — I2510 Atherosclerotic heart disease of native coronary artery without angina pectoris: Secondary | ICD-10-CM | POA: Diagnosis not present

## 2024-03-23 DIAGNOSIS — Z955 Presence of coronary angioplasty implant and graft: Secondary | ICD-10-CM | POA: Diagnosis not present

## 2024-03-23 DIAGNOSIS — Z0000 Encounter for general adult medical examination without abnormal findings: Secondary | ICD-10-CM | POA: Diagnosis not present

## 2024-03-23 DIAGNOSIS — J358 Other chronic diseases of tonsils and adenoids: Secondary | ICD-10-CM | POA: Diagnosis not present

## 2024-03-23 DIAGNOSIS — J3089 Other allergic rhinitis: Secondary | ICD-10-CM | POA: Diagnosis not present

## 2024-03-23 DIAGNOSIS — I2510 Atherosclerotic heart disease of native coronary artery without angina pectoris: Secondary | ICD-10-CM | POA: Diagnosis not present

## 2024-03-23 DIAGNOSIS — I10 Essential (primary) hypertension: Secondary | ICD-10-CM | POA: Diagnosis not present

## 2024-03-23 DIAGNOSIS — Z86718 Personal history of other venous thrombosis and embolism: Secondary | ICD-10-CM | POA: Diagnosis not present

## 2024-03-23 DIAGNOSIS — Z7982 Long term (current) use of aspirin: Secondary | ICD-10-CM | POA: Diagnosis not present

## 2024-03-24 LAB — LAB REPORT - SCANNED
Albumin, Urine POC: 7.2
Creatinine, POC: 177.6 mg/dL
EGFR: 69
Microalb Creat Ratio: 4

## 2024-03-25 ENCOUNTER — Ambulatory Visit: Payer: Self-pay | Admitting: Internal Medicine

## 2024-04-02 DIAGNOSIS — J359 Chronic disease of tonsils and adenoids, unspecified: Secondary | ICD-10-CM | POA: Diagnosis not present
